# Patient Record
Sex: Female | Born: 1965 | Hispanic: Yes | Marital: Married | Smoking: Never smoker
Health system: Southern US, Community
[De-identification: ages and names within clinical notes are randomized; demographics above are authoritative.]

## PROBLEM LIST (undated history)

## (undated) DIAGNOSIS — E079 Disorder of thyroid, unspecified: Secondary | ICD-10-CM

## (undated) DIAGNOSIS — I10 Essential (primary) hypertension: Secondary | ICD-10-CM

## (undated) DIAGNOSIS — K8020 Calculus of gallbladder without cholecystitis without obstruction: Secondary | ICD-10-CM

---

## 1999-03-26 ENCOUNTER — Ambulatory Visit (HOSPITAL_COMMUNITY): Admission: RE | Admit: 1999-03-26 | Discharge: 1999-03-26 | Payer: Self-pay | Admitting: *Deleted

## 2000-02-19 ENCOUNTER — Ambulatory Visit (HOSPITAL_COMMUNITY): Admission: RE | Admit: 2000-02-19 | Discharge: 2000-02-19 | Payer: Self-pay | Admitting: *Deleted

## 2000-07-21 ENCOUNTER — Inpatient Hospital Stay (HOSPITAL_COMMUNITY): Admission: AD | Admit: 2000-07-21 | Discharge: 2000-07-21 | Payer: Self-pay | Admitting: Obstetrics

## 2000-07-22 ENCOUNTER — Inpatient Hospital Stay (HOSPITAL_COMMUNITY): Admission: AD | Admit: 2000-07-22 | Discharge: 2000-07-22 | Payer: Self-pay | Admitting: *Deleted

## 2000-07-23 ENCOUNTER — Inpatient Hospital Stay (HOSPITAL_COMMUNITY): Admission: AD | Admit: 2000-07-23 | Discharge: 2000-07-26 | Payer: Self-pay | Admitting: *Deleted

## 2000-07-23 ENCOUNTER — Encounter (INDEPENDENT_AMBULATORY_CARE_PROVIDER_SITE_OTHER): Payer: Self-pay | Admitting: Specialist

## 2000-08-04 ENCOUNTER — Inpatient Hospital Stay (HOSPITAL_COMMUNITY): Admission: AD | Admit: 2000-08-04 | Discharge: 2000-08-04 | Payer: Self-pay | Admitting: Obstetrics

## 2000-08-12 ENCOUNTER — Encounter: Admission: RE | Admit: 2000-08-12 | Discharge: 2000-08-12 | Payer: Self-pay | Admitting: Obstetrics & Gynecology

## 2000-08-25 ENCOUNTER — Emergency Department (HOSPITAL_COMMUNITY): Admission: EM | Admit: 2000-08-25 | Discharge: 2000-08-25 | Payer: Self-pay | Admitting: Emergency Medicine

## 2003-08-22 ENCOUNTER — Emergency Department (HOSPITAL_COMMUNITY): Admission: EM | Admit: 2003-08-22 | Discharge: 2003-08-22 | Payer: Self-pay | Admitting: Emergency Medicine

## 2003-08-22 ENCOUNTER — Encounter: Payer: Self-pay | Admitting: Emergency Medicine

## 2004-09-01 ENCOUNTER — Emergency Department (HOSPITAL_COMMUNITY): Admission: EM | Admit: 2004-09-01 | Discharge: 2004-09-02 | Payer: Self-pay | Admitting: Emergency Medicine

## 2005-03-05 ENCOUNTER — Emergency Department (HOSPITAL_COMMUNITY): Admission: EM | Admit: 2005-03-05 | Discharge: 2005-03-05 | Payer: Self-pay | Admitting: Emergency Medicine

## 2005-09-09 ENCOUNTER — Emergency Department (HOSPITAL_COMMUNITY): Admission: EM | Admit: 2005-09-09 | Discharge: 2005-09-10 | Payer: Self-pay | Admitting: Emergency Medicine

## 2006-03-18 ENCOUNTER — Ambulatory Visit: Payer: Self-pay | Admitting: Nurse Practitioner

## 2006-03-27 ENCOUNTER — Encounter (HOSPITAL_COMMUNITY): Admission: RE | Admit: 2006-03-27 | Discharge: 2006-06-25 | Payer: Self-pay | Admitting: Internal Medicine

## 2006-04-01 ENCOUNTER — Ambulatory Visit: Payer: Self-pay | Admitting: Nurse Practitioner

## 2006-04-09 ENCOUNTER — Ambulatory Visit: Payer: Self-pay | Admitting: Nurse Practitioner

## 2006-04-17 ENCOUNTER — Ambulatory Visit (HOSPITAL_COMMUNITY): Admission: RE | Admit: 2006-04-17 | Discharge: 2006-04-17 | Payer: Self-pay | Admitting: Family Medicine

## 2006-07-01 ENCOUNTER — Ambulatory Visit: Payer: Self-pay | Admitting: Nurse Practitioner

## 2006-07-16 ENCOUNTER — Ambulatory Visit: Payer: Self-pay | Admitting: Nurse Practitioner

## 2006-08-06 ENCOUNTER — Ambulatory Visit: Payer: Self-pay | Admitting: Nurse Practitioner

## 2006-10-01 ENCOUNTER — Ambulatory Visit: Payer: Self-pay | Admitting: Nurse Practitioner

## 2006-10-30 ENCOUNTER — Ambulatory Visit: Payer: Self-pay | Admitting: Nurse Practitioner

## 2006-10-31 ENCOUNTER — Ambulatory Visit: Payer: Self-pay | Admitting: *Deleted

## 2006-11-17 ENCOUNTER — Ambulatory Visit: Payer: Self-pay | Admitting: Nurse Practitioner

## 2007-04-20 ENCOUNTER — Ambulatory Visit (HOSPITAL_COMMUNITY): Admission: RE | Admit: 2007-04-20 | Discharge: 2007-04-20 | Payer: Self-pay | Admitting: Family Medicine

## 2007-04-23 ENCOUNTER — Encounter (INDEPENDENT_AMBULATORY_CARE_PROVIDER_SITE_OTHER): Payer: Self-pay | Admitting: Nurse Practitioner

## 2007-04-23 ENCOUNTER — Ambulatory Visit: Payer: Self-pay | Admitting: Nurse Practitioner

## 2007-05-06 ENCOUNTER — Ambulatory Visit: Payer: Self-pay | Admitting: Nurse Practitioner

## 2007-07-22 ENCOUNTER — Encounter (INDEPENDENT_AMBULATORY_CARE_PROVIDER_SITE_OTHER): Payer: Self-pay | Admitting: Nurse Practitioner

## 2007-07-22 ENCOUNTER — Ambulatory Visit: Payer: Self-pay | Admitting: Internal Medicine

## 2007-07-22 LAB — CONVERTED CEMR LAB
Cholesterol: 232 mg/dL — ABNORMAL HIGH (ref 0–200)
HDL: 63 mg/dL (ref >39)
Total CHOL/HDL Ratio: 3.7
Triglycerides: 77 mg/dL (ref <150)
VLDL: 15 mg/dL (ref 0–40)

## 2007-09-02 ENCOUNTER — Encounter (INDEPENDENT_AMBULATORY_CARE_PROVIDER_SITE_OTHER): Payer: Self-pay | Admitting: *Deleted

## 2007-10-05 ENCOUNTER — Ambulatory Visit: Payer: Self-pay | Admitting: Internal Medicine

## 2007-10-05 ENCOUNTER — Encounter (INDEPENDENT_AMBULATORY_CARE_PROVIDER_SITE_OTHER): Payer: Self-pay | Admitting: Nurse Practitioner

## 2007-10-05 LAB — CONVERTED CEMR LAB
Cholesterol: 204 mg/dL — ABNORMAL HIGH (ref 0–200)
VLDL: 17 mg/dL (ref 0–40)

## 2007-10-08 ENCOUNTER — Ambulatory Visit: Payer: Self-pay | Admitting: Internal Medicine

## 2007-10-08 ENCOUNTER — Encounter (INDEPENDENT_AMBULATORY_CARE_PROVIDER_SITE_OTHER): Payer: Self-pay | Admitting: Nurse Practitioner

## 2007-10-08 LAB — CONVERTED CEMR LAB
ALT: 16 units/L (ref 0–35)
AST: 18 units/L (ref 0–37)
Alkaline Phosphatase: 66 units/L (ref 39–117)
BUN: 13 mg/dL (ref 6–23)
CO2: 24 meq/L (ref 19–32)
Cholesterol: 233 mg/dL — ABNORMAL HIGH (ref 0–200)
Glucose, Bld: 86 mg/dL (ref 70–99)
Sodium: 137 meq/L (ref 135–145)
Total Bilirubin: 0.5 mg/dL (ref 0.3–1.2)
VLDL: 24 mg/dL (ref 0–40)

## 2007-10-15 ENCOUNTER — Ambulatory Visit (HOSPITAL_COMMUNITY): Admission: RE | Admit: 2007-10-15 | Discharge: 2007-10-15 | Payer: Self-pay | Admitting: Family Medicine

## 2007-12-03 ENCOUNTER — Encounter (INDEPENDENT_AMBULATORY_CARE_PROVIDER_SITE_OTHER): Payer: Self-pay | Admitting: Nurse Practitioner

## 2007-12-03 ENCOUNTER — Ambulatory Visit: Payer: Self-pay | Admitting: Family Medicine

## 2008-03-16 ENCOUNTER — Ambulatory Visit: Payer: Self-pay | Admitting: Internal Medicine

## 2008-03-22 ENCOUNTER — Encounter (INDEPENDENT_AMBULATORY_CARE_PROVIDER_SITE_OTHER): Payer: Self-pay | Admitting: Nurse Practitioner

## 2008-03-22 ENCOUNTER — Ambulatory Visit: Payer: Self-pay | Admitting: Internal Medicine

## 2008-05-11 ENCOUNTER — Ambulatory Visit (HOSPITAL_COMMUNITY): Admission: RE | Admit: 2008-05-11 | Discharge: 2008-05-11 | Payer: Self-pay | Admitting: Family Medicine

## 2008-06-23 ENCOUNTER — Ambulatory Visit: Payer: Self-pay | Admitting: Internal Medicine

## 2008-07-12 ENCOUNTER — Ambulatory Visit: Payer: Self-pay | Admitting: Family Medicine

## 2008-07-12 ENCOUNTER — Encounter (INDEPENDENT_AMBULATORY_CARE_PROVIDER_SITE_OTHER): Payer: Self-pay | Admitting: Family Medicine

## 2008-07-12 LAB — CONVERTED CEMR LAB
Chlamydia, DNA Probe: NEGATIVE
GC Probe Amp, Genital: NEGATIVE

## 2008-11-17 ENCOUNTER — Ambulatory Visit: Payer: Self-pay | Admitting: Internal Medicine

## 2008-11-25 ENCOUNTER — Ambulatory Visit: Payer: Self-pay | Admitting: Internal Medicine

## 2008-12-29 ENCOUNTER — Encounter (INDEPENDENT_AMBULATORY_CARE_PROVIDER_SITE_OTHER): Payer: Self-pay | Admitting: Family Medicine

## 2008-12-29 ENCOUNTER — Ambulatory Visit: Payer: Self-pay | Admitting: Internal Medicine

## 2008-12-29 LAB — CONVERTED CEMR LAB: TSH: 2.977 microintl units/mL (ref 0.350–4.50)

## 2009-01-24 ENCOUNTER — Ambulatory Visit: Payer: Self-pay | Admitting: Internal Medicine

## 2009-01-24 ENCOUNTER — Encounter (INDEPENDENT_AMBULATORY_CARE_PROVIDER_SITE_OTHER): Payer: Self-pay | Admitting: Family Medicine

## 2009-01-24 LAB — CONVERTED CEMR LAB
ALT: 16 units/L (ref 0–35)
BUN: 17 mg/dL (ref 6–23)
Calcium: 9.3 mg/dL (ref 8.4–10.5)
Chloride: 105 meq/L (ref 96–112)
Cholesterol: 226 mg/dL — ABNORMAL HIGH (ref 0–200)
HDL: 68 mg/dL (ref >39)
LDL Cholesterol: 141 mg/dL — ABNORMAL HIGH (ref 0–99)
Potassium: 4.4 meq/L (ref 3.5–5.3)
Total CHOL/HDL Ratio: 3.3
Total Protein: 8.1 g/dL (ref 6.0–8.3)
Triglycerides: 85 mg/dL (ref <150)
VLDL: 17 mg/dL (ref 0–40)

## 2009-01-26 ENCOUNTER — Ambulatory Visit: Payer: Self-pay | Admitting: Internal Medicine

## 2009-03-13 ENCOUNTER — Ambulatory Visit: Payer: Self-pay | Admitting: Internal Medicine

## 2009-04-03 ENCOUNTER — Ambulatory Visit: Payer: Self-pay | Admitting: Internal Medicine

## 2009-05-29 ENCOUNTER — Ambulatory Visit: Payer: Self-pay | Admitting: Internal Medicine

## 2009-05-31 ENCOUNTER — Ambulatory Visit (HOSPITAL_COMMUNITY): Admission: RE | Admit: 2009-05-31 | Discharge: 2009-05-31 | Payer: Self-pay | Admitting: Family Medicine

## 2009-06-01 ENCOUNTER — Ambulatory Visit: Payer: Self-pay | Admitting: Internal Medicine

## 2009-06-01 ENCOUNTER — Ambulatory Visit: Admission: RE | Admit: 2009-06-01 | Discharge: 2009-06-01 | Payer: Self-pay | Admitting: Internal Medicine

## 2009-06-01 ENCOUNTER — Emergency Department (HOSPITAL_COMMUNITY): Admission: EM | Admit: 2009-06-01 | Discharge: 2009-06-01 | Payer: Self-pay | Admitting: Emergency Medicine

## 2009-06-15 ENCOUNTER — Encounter: Admission: RE | Admit: 2009-06-15 | Discharge: 2009-06-15 | Payer: Self-pay | Admitting: *Deleted

## 2009-07-24 ENCOUNTER — Encounter: Admission: RE | Admit: 2009-07-24 | Discharge: 2009-09-12 | Payer: Self-pay | Admitting: Orthopedic Surgery

## 2009-07-24 ENCOUNTER — Ambulatory Visit: Payer: Self-pay | Admitting: Internal Medicine

## 2009-12-12 ENCOUNTER — Emergency Department (HOSPITAL_COMMUNITY): Admission: EM | Admit: 2009-12-12 | Discharge: 2009-12-13 | Payer: Self-pay | Admitting: Emergency Medicine

## 2009-12-14 ENCOUNTER — Ambulatory Visit: Payer: Self-pay | Admitting: Internal Medicine

## 2009-12-14 ENCOUNTER — Encounter (INDEPENDENT_AMBULATORY_CARE_PROVIDER_SITE_OTHER): Payer: Self-pay | Admitting: Family Medicine

## 2009-12-14 LAB — CONVERTED CEMR LAB
Chlamydia, DNA Probe: NEGATIVE
GC Probe Amp, Genital: NEGATIVE

## 2009-12-26 ENCOUNTER — Ambulatory Visit: Payer: Self-pay | Admitting: Internal Medicine

## 2009-12-26 LAB — CONVERTED CEMR LAB: TSH: 1.092 microintl units/mL (ref 0.350–4.500)

## 2010-02-07 ENCOUNTER — Ambulatory Visit: Payer: Self-pay | Admitting: Obstetrics and Gynecology

## 2010-02-07 ENCOUNTER — Other Ambulatory Visit: Admission: RE | Admit: 2010-02-07 | Discharge: 2010-02-07 | Payer: Self-pay | Admitting: Obstetrics and Gynecology

## 2010-04-04 ENCOUNTER — Encounter (INDEPENDENT_AMBULATORY_CARE_PROVIDER_SITE_OTHER): Payer: Self-pay | Admitting: Family Medicine

## 2010-04-04 ENCOUNTER — Ambulatory Visit: Payer: Self-pay | Admitting: Internal Medicine

## 2010-04-04 LAB — CONVERTED CEMR LAB: TSH: 0.013 microintl units/mL — ABNORMAL LOW (ref 0.350–4.500)

## 2010-05-01 ENCOUNTER — Ambulatory Visit: Payer: Self-pay | Admitting: Internal Medicine

## 2010-05-01 LAB — CONVERTED CEMR LAB
BUN: 14 mg/dL (ref 6–23)
CO2: 23 meq/L (ref 19–32)
Free T4: 1.04 ng/dL (ref 0.80–1.80)
HDL: 49 mg/dL (ref >39)
Sodium: 139 meq/L (ref 135–145)
T3, Free: 2.6 pg/mL (ref 2.3–4.2)
VLDL: 23 mg/dL (ref 0–40)

## 2010-05-09 ENCOUNTER — Ambulatory Visit: Payer: Self-pay | Admitting: Internal Medicine

## 2010-05-23 ENCOUNTER — Ambulatory Visit: Payer: Self-pay | Admitting: Family Medicine

## 2010-05-30 ENCOUNTER — Ambulatory Visit: Payer: Self-pay | Admitting: Internal Medicine

## 2010-06-07 ENCOUNTER — Ambulatory Visit: Payer: Self-pay | Admitting: Internal Medicine

## 2010-07-12 ENCOUNTER — Ambulatory Visit: Payer: Self-pay | Admitting: Internal Medicine

## 2010-07-12 LAB — CONVERTED CEMR LAB
BUN: 17 mg/dL (ref 6–23)
Calcium: 9.3 mg/dL (ref 8.4–10.5)
Creatinine, Ser: 0.8 mg/dL (ref 0.40–1.20)
Direct LDL: 163 mg/dL — ABNORMAL HIGH
TSH: 11.404 microintl units/mL — ABNORMAL HIGH (ref 0.350–4.500)

## 2010-08-09 ENCOUNTER — Emergency Department (HOSPITAL_COMMUNITY): Admission: EM | Admit: 2010-08-09 | Discharge: 2010-08-10 | Payer: Self-pay | Admitting: Emergency Medicine

## 2010-10-09 ENCOUNTER — Encounter (INDEPENDENT_AMBULATORY_CARE_PROVIDER_SITE_OTHER): Payer: Self-pay | Admitting: *Deleted

## 2010-10-09 LAB — CONVERTED CEMR LAB
ALT: 16 units/L (ref 0–35)
Albumin: 4.5 g/dL (ref 3.5–5.2)
Alkaline Phosphatase: 76 units/L (ref 39–117)
BUN: 15 mg/dL (ref 6–23)
CO2: 26 meq/L (ref 19–32)
Creatinine, Ser: 0.8 mg/dL (ref 0.40–1.20)
Sodium: 137 meq/L (ref 135–145)
Total Bilirubin: 0.3 mg/dL (ref 0.3–1.2)
Total Protein: 7.9 g/dL (ref 6.0–8.3)

## 2010-11-08 ENCOUNTER — Emergency Department (HOSPITAL_COMMUNITY)
Admission: EM | Admit: 2010-11-08 | Discharge: 2010-11-09 | Payer: Self-pay | Source: Home / Self Care | Admitting: Emergency Medicine

## 2011-02-26 ENCOUNTER — Encounter (INDEPENDENT_AMBULATORY_CARE_PROVIDER_SITE_OTHER): Payer: Self-pay | Admitting: *Deleted

## 2011-02-26 LAB — URINALYSIS, ROUTINE W REFLEX MICROSCOPIC
Bilirubin Urine: NEGATIVE
Glucose, UA: NEGATIVE mg/dL
Leukocytes, UA: NEGATIVE
Protein, ur: NEGATIVE mg/dL
Specific Gravity, Urine: 1.023 (ref 1.005–1.030)
Urobilinogen, UA: 0.2 mg/dL (ref 0.0–1.0)

## 2011-02-26 LAB — URINE MICROSCOPIC-ADD ON

## 2011-02-26 LAB — DIFFERENTIAL
Basophils Relative: 1 % (ref 0–1)
Eosinophils Relative: 2 % (ref 0–5)
Lymphs Abs: 2.8 10*3/uL (ref 0.7–4.0)

## 2011-02-26 LAB — COMPREHENSIVE METABOLIC PANEL
AST: 23 U/L (ref 0–37)
BUN: 15 mg/dL (ref 6–23)
CO2: 25 mEq/L (ref 19–32)
Calcium: 9.4 mg/dL (ref 8.4–10.5)
Creatinine, Ser: 0.81 mg/dL (ref 0.4–1.2)
GFR calc Af Amer: >60 mL/min (ref >60)
GFR calc non Af Amer: >60 mL/min (ref >60)
Sodium: 138 mEq/L (ref 135–145)

## 2011-02-26 LAB — CBC
Hemoglobin: 15.3 g/dL — ABNORMAL HIGH (ref 12.0–15.0)
MCH: 31.7 pg (ref 26.0–34.0)
MCHC: 34.8 g/dL (ref 30.0–36.0)
Platelets: 233 10*3/uL (ref 150–400)
WBC: 7.8 10*3/uL (ref 4.0–10.5)

## 2011-02-26 LAB — CONVERTED CEMR LAB
Calcium: 9.4 mg/dL (ref 8.4–10.5)
Glucose, Bld: 85 mg/dL (ref 70–99)
Potassium: 3.7 meq/L (ref 3.5–5.3)
Sodium: 136 meq/L (ref 135–145)

## 2011-02-28 LAB — COMPREHENSIVE METABOLIC PANEL
ALT: 27 U/L (ref 0–35)
Alkaline Phosphatase: 57 U/L (ref 39–117)
BUN: 15 mg/dL (ref 6–23)
CO2: 22 mEq/L (ref 19–32)
Chloride: 108 mEq/L (ref 96–112)
GFR calc non Af Amer: >60 mL/min (ref >60)
Glucose, Bld: 87 mg/dL (ref 70–99)
Potassium: 2.7 mEq/L — CL (ref 3.5–5.1)
Sodium: 136 mEq/L (ref 135–145)
Total Bilirubin: 0.5 mg/dL (ref 0.3–1.2)
Total Protein: 7.3 g/dL (ref 6.0–8.3)

## 2011-02-28 LAB — URINALYSIS, ROUTINE W REFLEX MICROSCOPIC
Bilirubin Urine: NEGATIVE
Nitrite: NEGATIVE
Protein, ur: NEGATIVE mg/dL
Specific Gravity, Urine: 1.025 (ref 1.005–1.030)
Urobilinogen, UA: 1 mg/dL (ref 0.0–1.0)

## 2011-02-28 LAB — DIFFERENTIAL
Basophils Absolute: 0 10*3/uL (ref 0.0–0.1)
Basophils Relative: 0 % (ref 0–1)
Eosinophils Absolute: 0.1 10*3/uL (ref 0.0–0.7)
Monocytes Relative: 5 % (ref 3–12)
Neutro Abs: 8.2 10*3/uL — ABNORMAL HIGH (ref 1.7–7.7)
Neutrophils Relative %: 80 % — ABNORMAL HIGH (ref 43–77)

## 2011-02-28 LAB — CBC
HCT: 41 % (ref 36.0–46.0)
Hemoglobin: 14.2 g/dL (ref 12.0–15.0)
MCV: 91.4 fL (ref 78.0–100.0)
RDW: 13.4 % (ref 11.5–15.5)
WBC: 10.3 10*3/uL (ref 4.0–10.5)

## 2011-02-28 LAB — URINE MICROSCOPIC-ADD ON

## 2011-02-28 LAB — POCT PREGNANCY, URINE: Preg Test, Ur: NEGATIVE

## 2011-03-18 LAB — URINE MICROSCOPIC-ADD ON

## 2011-03-18 LAB — COMPREHENSIVE METABOLIC PANEL
BUN: 12 mg/dL (ref 6–23)
Calcium: 8.9 mg/dL (ref 8.4–10.5)
Glucose, Bld: 111 mg/dL — ABNORMAL HIGH (ref 70–99)
Total Protein: 7.4 g/dL (ref 6.0–8.3)

## 2011-03-18 LAB — DIFFERENTIAL
Lymphocytes Relative: 21 % (ref 12–46)
Lymphs Abs: 1.4 10*3/uL (ref 0.7–4.0)
Monocytes Relative: 3 % (ref 3–12)
Neutro Abs: 5 10*3/uL (ref 1.7–7.7)
Neutrophils Relative %: 74 % (ref 43–77)

## 2011-03-18 LAB — URINALYSIS, ROUTINE W REFLEX MICROSCOPIC
Ketones, ur: NEGATIVE mg/dL
Leukocytes, UA: NEGATIVE
Nitrite: NEGATIVE
Protein, ur: NEGATIVE mg/dL
pH: 6 (ref 5.0–8.0)

## 2011-03-18 LAB — CBC
HCT: 40.8 % (ref 36.0–46.0)
Hemoglobin: 13.9 g/dL (ref 12.0–15.0)
MCHC: 34 g/dL (ref 30.0–36.0)
Platelets: 231 10*3/uL (ref 150–400)
RDW: 13.9 % (ref 11.5–15.5)

## 2011-05-03 NOTE — Discharge Summary (Signed)
[facility]  Patient:    Deborah Aguirre, Deborah Aguirre                        MRN: [id number] Adm. Date:  [id number] Disc. Date: [id number] Attending:  Michaelle Copas Dictator:   Pricilla Holm, M.D.                           Discharge Summary  PROCEDURES:                   Repeat low transverse cesarean section.  HOSPITAL COURSE:              This is a 45 year old Hispanic female, g2, p1-0-0-1, at 40 weeks and two days who presented to triage complaining of contractions since early morning.  She was having a small amount of bleeding but no loss of fluid at that time.  She was admitted to O&D in active labor and had artificial rupture of membranes at that time.  She requested an epidural.  She was followed closely and was found to have arrest of dilatation with a compound presentation.  She was found to have meconium stained fluid upon artificial rupture of membranes.  She has a history of previous C-section for arrest of dilatation.  The patient was taken to the OR on July 23, 2000, secondary to an unchanged exam with arm presenting and asynclitic vertex position and very high.  The patient underwent repeat low transverse c-section with Dr. Coral Ceo as surgeon and Dr. ______  as assistant.  Anesthesia was epidural anesthesia.  The patient tolerated the procedure well and findings were a viable female at 15:56 with Apgars of 8 at one minute and 9 at five minutes weighing 8 pounds and 1 ounce.  Cord pH of 7.38.  Normal uterus, ovaries, and fallopian tubes were found.  The patient tolerated the procedure well and was taken to recovery room .  The patient received routine postoperative care and was found to be doing quite well.  Postoperative day #3, she was deemed appropriate to go home.  LABORATORY DATA:              Hemoglobin 9.6, hematocrit 29.6 on July 24, 2000.  CONDITION ON DISCHARGE:       Good.  DISPOSITION:                  Discharged to  home with specific instructions.  DISCHARGE MEDICATIONS:        The patient was given a prescription for Vicodin 1-2 tablets p.o. q.6h. p.r.n. #20, instructed to take ibuprofen over-the-counter 1-2 q.4-6h. p.r.n. pain.  The patient was also given Micronor 1 p.o. q.d. as directed with one refill.  INSTRUCTIONS:                 The patient was given a booklet on activities and diet as well as wound and dressing changes.  FOLLOW-UP:                    The patient will follow-up in six weeks at [facility] for her six week follow-up.                                The patient voiced agreement and understanding of the above discharge plan and had no further questions. DD:  07/26/00 TD:  07/28/00 Job: [id number]

## 2011-05-03 NOTE — Op Note (Signed)
[facility]  Patient:    Deborah Aguirre, Deborah Aguirre                        MRN: [id number] Proc. Date: 07/23/00 Attending:  Michaelle Copas CC:         OB/GYN teaching service; attn: Jamey Reas, M.D.   Operative Report  PREOPERATIVE DIAGNOSES:       1. Arrest of dilation.                               2. Compound presentation.                               3. Meconium stained fluid.                               4. Previous cesarean section for arrest of                                  dilation.  POSTOPERATIVE DIAGNOSES:      1. Arrest of dilation.                               2. Compound presentation.                               3. Meconium stained fluid.                               4. Previous cesarean section for arrest of                                  dilation.  OPERATION:                    Repeat low transverse cesarean section.  SURGEON:                      Charles A. Clearance Coots, M.D.  ASSISTANT:                    Jamey Reas, M.D.  ANESTHESIA:                   Epidural anesthesia.  ESTIMATED BLOOD LOSS:         700 mL.  IV FLUIDS:                    400 mL.  URINE OUTPUT:                 300 mL clear.  COMPLICATIONS:                None.  DRAINS:                       Foley catheter to gravity.  FINDINGS:                     Viable female at 44, Apgars of  8 at one minute and 9 at five minutes, weight of 8 pounds and 1 ounce.  Cord pH of 7.38. Normal uterus, ovaries and fallopian tubes.  DESCRIPTION OF PROCEDURE:  The patient was brought to the operating room and after satisfactory redosing of the epidural, the abdomen was prepped and draped in a usual sterile fashion.  A Pfannenstiel skin incision was made through the previous scar with a scalpel that was deepened down to the fascia with a scalpel. The fascia was nicked in the midline and the fascial incision was extended to the left and to the right with  curved Mayo scissors. The superior and inferior fascial edges were taken off of the rectus muscle with both blunt and sharp dissection.  The rectus muscle was sharply and bluntly divided in the midline.  The peritoneum was digitally entered and was extended to the left and to the right digitally.  The bladder blade was positioned. The vesicouterine fold of peritoneum above the reflection of the urinary bladder was grasped with forceps and was incised and undermined with Metzenbaum scissors. The incision was extended to the left and to the right with Metzenbaum scissors.  The bladder flap was bluntly developed and the bladder blade was repositioned in front of the urinary bladder, placing it well out of operative field.  The uterus was entered transversely in the lower uterine segment with the scalpel. The uterine incision was then digitally extended to the left and to the right.  Moderate amount of meconium stained fluid was expelled.  The vertex was then delivered with the aid of fundal pressure from the assistant.  The infants mouth and nose was suctioned with the DeLee suction and the delivery was completed with the aid of fundal pressure from the assistant.  The umbilical cord was clamped and cut and the infant was handed off to the nursery staff.  Cord pH and cord blood were obtained and the placenta was spontaneously expelled from the uterine cavity intact.  The uterus was exteriorized and the endometrial surface was debrided with a dry lap sponge.  The edges of the uterine incision were grasped with ring forceps and the uterus was closed with continuous interlocking sutures of 0 Monocryl. Hemostasis was excellent. The uterus was then placed back in its normal anatomic position.  The pelvic cavity was thoroughly irrigated with warm saline solution and all clots were removed.  The abdomen was then closed as follows.  The rectus muscle was approximated with a few interrupted sutures  of 2-0 Monocryl.  The fascia was closed with a continuous suture of 0 PDS.  The subcutaneous tissue was thoroughly irrigated with warm saline solution and all areas of subcutaneous bleeding were coagulated with the Bovie.  The skin was then approximated with surgical stainless steel staples.  Sterile bandage was applied to the incision closure.  Surgical technician indicated that all sponge, needle and instrument counts were correct.  The patient tolerated the procedure well and was transported to the recovery room in satisfactory condition. DD:  07/23/00 TD:  07/24/00 Job: [id number]

## 2011-10-24 ENCOUNTER — Other Ambulatory Visit (HOSPITAL_COMMUNITY)
Admission: RE | Admit: 2011-10-24 | Discharge: 2011-10-24 | Disposition: A | Discharge status: Home / Self Care | Payer: Self-pay | Source: Ambulatory Visit | Attending: Family Medicine | Admitting: Family Medicine

## 2011-10-24 ENCOUNTER — Other Ambulatory Visit: Payer: Self-pay | Admitting: Family Medicine

## 2011-10-24 DIAGNOSIS — Z01419 Encounter for gynecological examination (general) (routine) without abnormal findings: Secondary | ICD-10-CM | POA: Insufficient documentation

## 2012-02-09 ENCOUNTER — Emergency Department (HOSPITAL_COMMUNITY)
Admission: EM | Admit: 2012-02-09 | Discharge: 2012-02-10 | Disposition: A | Discharge status: Home / Self Care | Payer: Self-pay | Attending: Emergency Medicine | Admitting: Emergency Medicine

## 2012-02-09 ENCOUNTER — Emergency Department (HOSPITAL_COMMUNITY): Payer: Self-pay

## 2012-02-09 ENCOUNTER — Encounter (HOSPITAL_COMMUNITY): Payer: Self-pay | Admitting: *Deleted

## 2012-02-09 DIAGNOSIS — I10 Essential (primary) hypertension: Secondary | ICD-10-CM | POA: Insufficient documentation

## 2012-02-09 DIAGNOSIS — Z79899 Other long term (current) drug therapy: Secondary | ICD-10-CM | POA: Insufficient documentation

## 2012-02-09 DIAGNOSIS — R1011 Right upper quadrant pain: Secondary | ICD-10-CM | POA: Insufficient documentation

## 2012-02-09 DIAGNOSIS — K8020 Calculus of gallbladder without cholecystitis without obstruction: Secondary | ICD-10-CM | POA: Insufficient documentation

## 2012-02-09 DIAGNOSIS — E039 Hypothyroidism, unspecified: Secondary | ICD-10-CM | POA: Insufficient documentation

## 2012-02-09 HISTORY — DX: Calculus of gallbladder without cholecystitis without obstruction: K80.20

## 2012-02-09 HISTORY — DX: Essential (primary) hypertension: I10

## 2012-02-09 HISTORY — DX: Disorder of thyroid, unspecified: E07.9

## 2012-02-09 LAB — URINALYSIS, ROUTINE W REFLEX MICROSCOPIC
Glucose, UA: NEGATIVE mg/dL
Leukocytes, UA: NEGATIVE
Protein, ur: NEGATIVE mg/dL
Specific Gravity, Urine: 1.015 (ref 1.005–1.030)
pH: 6 (ref 5.0–8.0)

## 2012-02-09 LAB — CBC
HCT: 42.2 % (ref 36.0–46.0)
RDW: 12.3 % (ref 11.5–15.5)
WBC: 11.9 10*3/uL — ABNORMAL HIGH (ref 4.0–10.5)

## 2012-02-09 LAB — BASIC METABOLIC PANEL
CO2: 23 mEq/L (ref 19–32)
Chloride: 99 mEq/L (ref 96–112)
Creatinine, Ser: 0.68 mg/dL (ref 0.50–1.10)
Sodium: 132 mEq/L — ABNORMAL LOW (ref 135–145)

## 2012-02-09 LAB — DIFFERENTIAL
Basophils Absolute: 0 10*3/uL (ref 0.0–0.1)
Lymphocytes Relative: 12 % (ref 12–46)
Monocytes Absolute: 0.5 10*3/uL (ref 0.1–1.0)
Neutro Abs: 9.8 10*3/uL — ABNORMAL HIGH (ref 1.7–7.7)

## 2012-02-09 LAB — URINE MICROSCOPIC-ADD ON

## 2012-02-09 LAB — PREGNANCY, URINE: Preg Test, Ur: NEGATIVE

## 2012-02-09 NOTE — ED Notes (Signed)
[place] at bedside at this time. Lab services called and additional labs added on.

## 2012-02-09 NOTE — ED Notes (Signed)
Pt in c/o abd pain, history of gallstones, denies vomiting today

## 2012-02-09 NOTE — ED Notes (Signed)
Patient reports right flank pain radiating around to her RUQ ABD that began at approx. 1900 tonight accompanied with nausea. Denies emesis. Hx of gallstones. Patient reports taking expired prescription of Hydrocodone-Acetaminophin 10-650 mg 1 tab, pta with no relief.

## 2012-02-10 LAB — HEPATIC FUNCTION PANEL
ALT: 31 U/L (ref 0–35)
Bilirubin, Direct: <0.1 mg/dL (ref 0.0–0.3)
Total Protein: 8 g/dL (ref 6.0–8.3)

## 2012-02-10 LAB — LIPASE, BLOOD: Lipase: 30 U/L (ref 11–59)

## 2012-02-10 MED ORDER — HYDROCODONE-ACETAMINOPHEN 5-325 MG PO TABS: 1.0000 | ORAL_TABLET | ORAL | Status: AC | PRN

## 2012-02-10 NOTE — ED Provider Notes (Signed)
Medical screening examination/treatment/procedure(s) were performed by non-physician practitioner and as supervising physician I was immediately available for consultation/collaboration.   Lyanne Co, MD 02/10/12 1556

## 2012-02-10 NOTE — ED Provider Notes (Signed)
History     CSN: [id number]  Arrival date & time 02/09/12  2100   First MD Initiated Contact with Patient 02/09/12 2227      Chief Complaint  Patient presents with  . Abdominal Pain    (Consider location/radiation/quality/duration/timing/severity/associated sxs/prior treatment) HPI Comments: Patient with history of known gallstones presents with RUQ abdominal pain that started today at 1300 - this was not associated with food - she denies fever, chills, nausea, vomiting, diarrhea, constipation with this - states she took 1/2 pain medication and then a second 1/2 without relief of pain - states that once she arrived here, the pain spontaneously resolved.  Patient is a 46 y.o. female presenting with abdominal pain. The history is provided by the patient and the spouse. No language interpreter was used.  Abdominal Pain The primary symptoms of the illness include abdominal pain. The primary symptoms of the illness do not include fever, fatigue, shortness of breath, nausea, vomiting, diarrhea, hematemesis, hematochezia, dysuria, vaginal discharge or vaginal bleeding. The current episode started 6 to 12 hours ago. The onset of the illness was gradual. The problem has been resolved.  The patient states that she believes she is currently not pregnant. The patient has not had a change in bowel habit. Symptoms associated with the illness do not include chills, anorexia, diaphoresis, heartburn, constipation, urgency, hematuria, frequency or back pain. Significant associated medical issues include gallstones.    Past Medical History  Diagnosis Date  . Gallstone   . Hypertension   . Thyroid disease     History reviewed. No pertinent past surgical history.  History reviewed. No pertinent family history.  History  Substance Use Topics  . Smoking status: Not on file  . Smokeless tobacco: Not on file  . Alcohol Use:     OB History    Grav Para Term Preterm Abortions TAB SAB Ect Mult Living                    Review of Systems  Constitutional: Negative for fever, chills, diaphoresis and fatigue.  Respiratory: Negative for shortness of breath.   Gastrointestinal: Positive for abdominal pain. Negative for heartburn, nausea, vomiting, diarrhea, constipation, hematochezia, anorexia and hematemesis.  Genitourinary: Negative for dysuria, urgency, frequency, hematuria, vaginal bleeding and vaginal discharge.  Musculoskeletal: Negative for back pain.  All other systems reviewed and are negative.    Allergies  Review of patient's allergies indicates no known allergies.  Home Medications   Current Outpatient Rx  Name Route Sig Dispense Refill  . AMLODIPINE BESYLATE 10 MG PO TABS Oral Take 10 mg by mouth daily.    Marland Kitchen HYDROCHLOROTHIAZIDE 25 MG PO TABS Oral Take 25 mg by mouth daily.    Marland Kitchen HYDROCODONE-ACETAMINOPHEN 10-650 MG PO TABS Oral Take 1 tablet by mouth every 6 (six) hours as needed. Pain    . LEVOTHYROXINE SODIUM 100 MCG PO TABS Oral Take 100 mcg by mouth daily.    Marland Kitchen METOPROLOL SUCCINATE ER 50 MG PO TB24 Oral Take 50 mg by mouth daily. Take with or immediately following a meal.      BP 136/80  Pulse 73  Temp(Src) 98.5 F (36.9 C) (Oral)  Resp 16  SpO2 96%  Physical Exam  Nursing note and vitals reviewed. Constitutional: She is oriented to person, place, and time. She appears well-developed and well-nourished. No distress.  HENT:  Head: Normocephalic and atraumatic.  Right Ear: External ear normal.  Left Ear: External ear normal.  Nose: Nose normal.  Mouth/Throat: Oropharynx is clear and moist. No oropharyngeal exudate.  Eyes: Conjunctivae are normal. Pupils are equal, round, and reactive to light. No scleral icterus.  Neck: Normal range of motion. Neck supple.  Cardiovascular: Normal rate, regular rhythm and normal heart sounds.  Exam reveals no gallop and no friction rub.   No murmur heard. Pulmonary/Chest: Effort normal and breath sounds normal. No respiratory  distress. She exhibits no tenderness.  Abdominal: Soft. Bowel sounds are normal. She exhibits no distension and no mass. There is no tenderness. There is no rebound and no guarding.  Musculoskeletal: Normal range of motion. She exhibits no edema and no tenderness.  Lymphadenopathy:    She has no cervical adenopathy.  Neurological: She is alert and oriented to person, place, and time. No cranial nerve deficit.  Skin: Skin is warm and dry. No rash noted. No erythema. No pallor.  Psychiatric: She has a normal mood and affect. Her behavior is normal. Judgment and thought content normal.    ED Course  Procedures (including critical care time)  Labs Reviewed  URINALYSIS, ROUTINE W REFLEX MICROSCOPIC - Abnormal; Notable for the following:    Hgb urine dipstick TRACE (*)    All other components within normal limits  CBC - Abnormal; Notable for the following:    WBC 11.9 (*)    Hemoglobin 15.4 (*)    MCHC 36.5 (*)    All other components within normal limits  DIFFERENTIAL - Abnormal; Notable for the following:    Neutrophils Relative 82 (*)    Neutro Abs 9.8 (*)    All other components within normal limits  BASIC METABOLIC PANEL - Abnormal; Notable for the following:    Sodium 132 (*)    Potassium 3.3 (*)    Glucose, Bld 119 (*)    All other components within normal limits  URINE MICROSCOPIC-ADD ON - Abnormal; Notable for the following:    Squamous Epithelial / LPF FEW (*)    All other components within normal limits  HEPATIC FUNCTION PANEL - Abnormal; Notable for the following:    Total Bilirubin 0.2 (*)    All other components within normal limits  PREGNANCY, URINE  LIPASE, BLOOD   US Abdomen Complete  02/10/2012  *RADIOLOGY REPORT*  Clinical Data:  Right upper quadrant abdominal pain; history of gallstones.  Leukocytosis.  ABDOMINAL ULTRASOUND COMPLETE  Comparison:  Abdominal ultrasound performed 11/09/2010  Findings:  Gallbladder: Two large stones are noted within the gallbladder,  both measuring 2.8 cm in size.  One of these is lodged at the neck of the gallbladder, as on the prior study.  Both stones appear to have increased mildly in size since 2011.  There is no evidence of gallbladder wall thickening or pericholecystic fluid to suggest cholecystitis.  There is no evidence for obstruction.  No ultrasonographic Murphy's sign is elicited.  Common Bile Duct:  0.6 cm in diameter; within normal limits in caliber.  Liver:  Normal parenchymal echogenicity and echotexture; no focal lesions identified.  Limited Doppler evaluation demonstrates normal blood flow within the liver.  IVC:  Unremarkable in appearance.  Pancreas:  Although the pancreas is difficult to visualize in its entirety due to overlying bowel gas, no focal pancreatic abnormality is identified.  Spleen:  9.1 cm in length; within normal limits in size and echotexture.  Right kidney:  10.8 cm in length; normal in size, configuration and parenchymal echogenicity.  No evidence of mass or hydronephrosis.  Left kidney:  11.3 cm in length; normal in  size, configuration and parenchymal echogenicity.  No evidence of mass or hydronephrosis.  Abdominal Aorta:  Normal in caliber; no aneurysm identified.  IMPRESSION: Cholelithiasis again noted, without evidence for obstruction or cholecystitis.  One of the two large stones remains lodged at the neck of the gallbladder, unchanged from 2011.  Both stones have increased mildly in size since 2011.  No ultrasonographic Murphy's sign seen.  Original Report Authenticated By: Tonia Ghent, M.D.     Cholelithiasis    MDM  Patient with known gallstones without signs of cholecystitis, she does have a noted leukocytosis but I do not believe this is related to infection.  Afebrile and without pain since her arrival to the ER.  Will refer to surgery on an outpatient basis and give pain medication - she has been instructed to return with worsening pain, pain not controlled with medication, fever,  chills, nausea or vomiting.  She and her husband express understanding of this.        Izola Price [place], [place] 02/10/12 [phone]

## 2012-04-18 IMAGING — US US ABDOMEN COMPLETE
1 series · 13 of 25 positions shown · non-contrast
Comparison: Abdominal ultrasound performed 11/09/2010

CLINICAL DATA: Right upper quadrant abdominal pain; history of
gallstones.  Leukocytosis.

ABDOMINAL ULTRASOUND COMPLETE

[Series 1: us abdomen complete · 0.26mm/px · 13 of 58 slices shown]
[im 1/58]
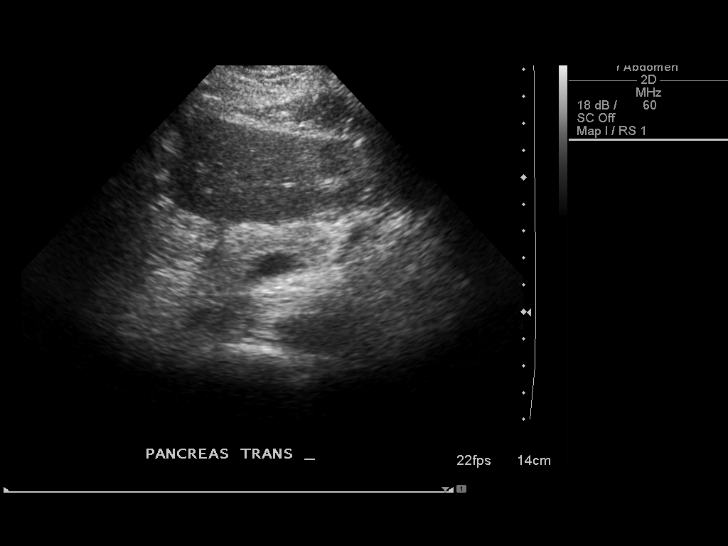
[im 5/58]
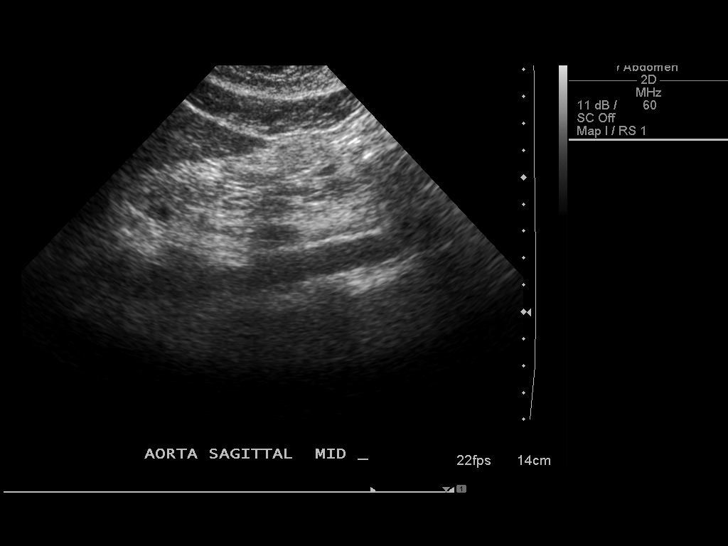
[im 10/58]
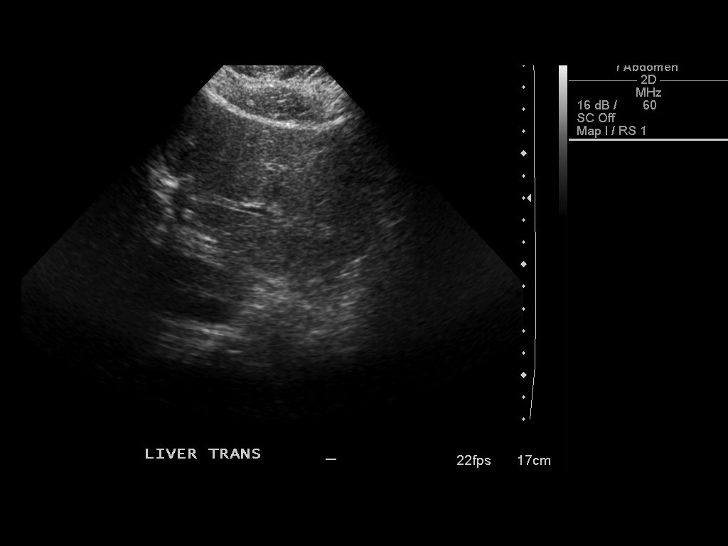
[im 15/58]
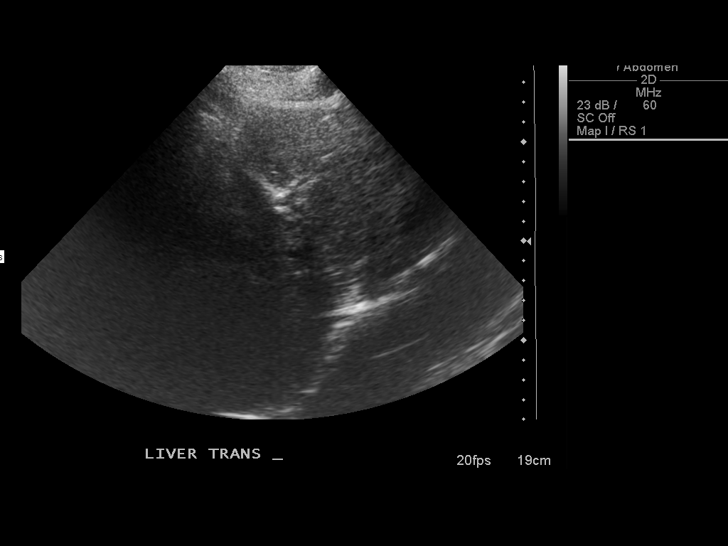
[im 20/58]
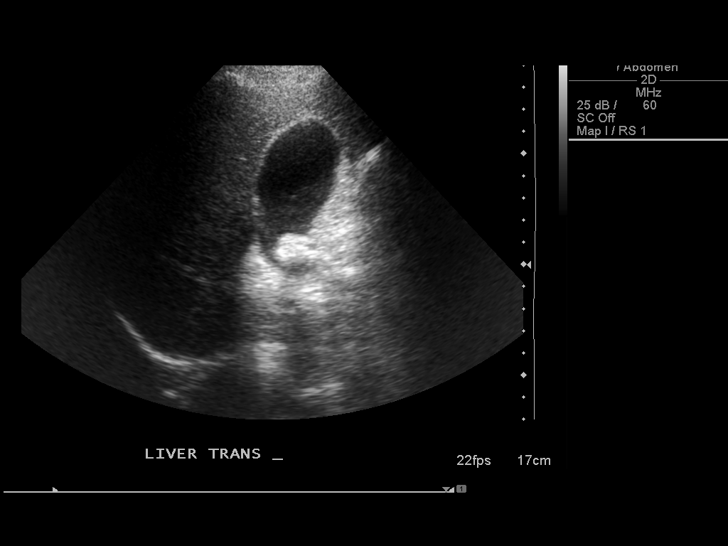
[im 24/58]
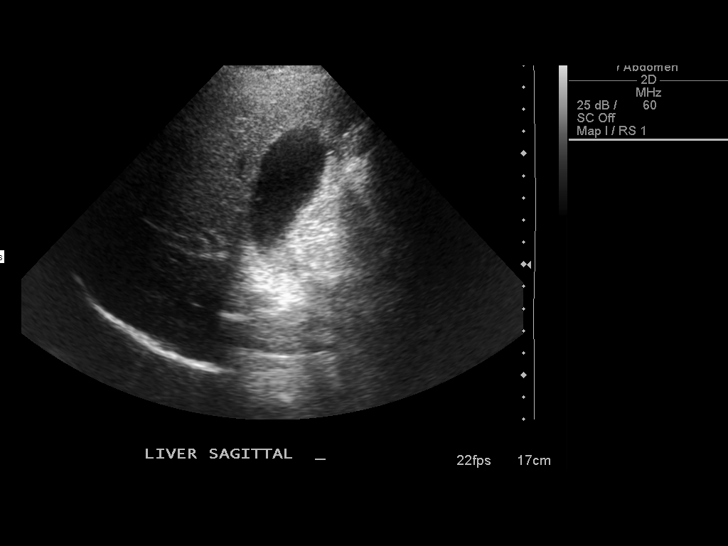
[im 29/58]
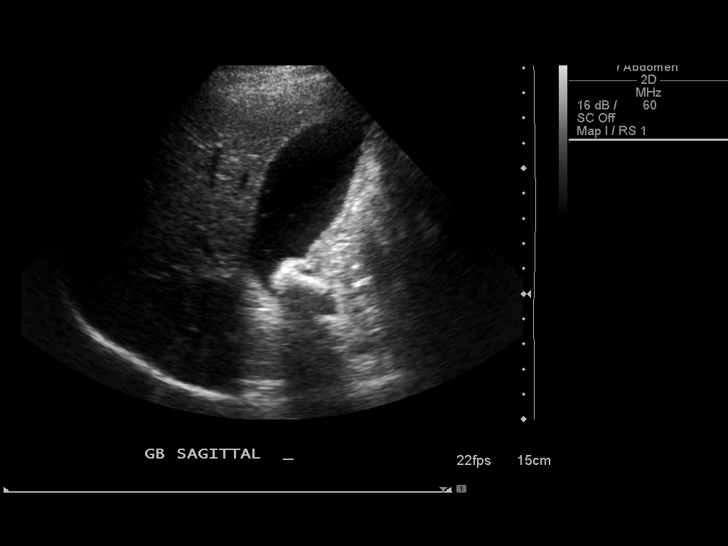
[im 34/58]
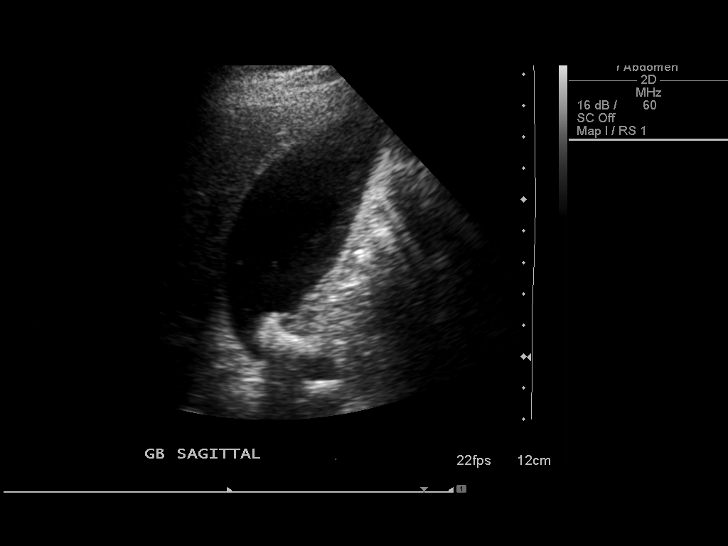
[im 39/58]
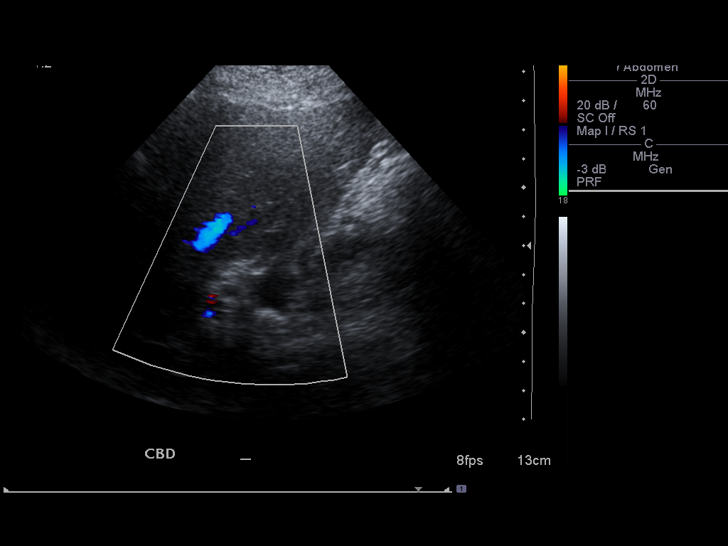
[im 43/58]
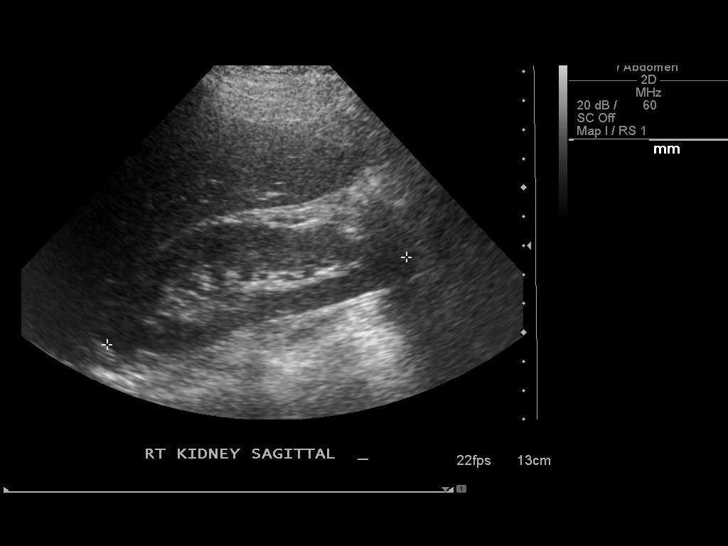
[im 48/58]
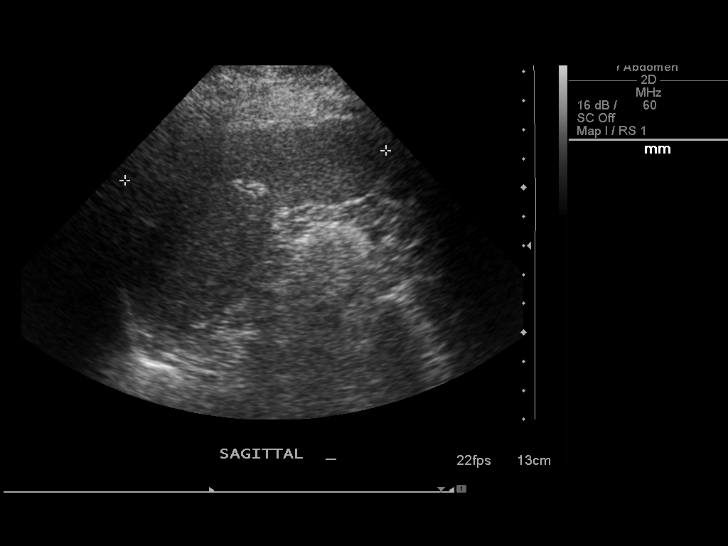
[im 53/58]
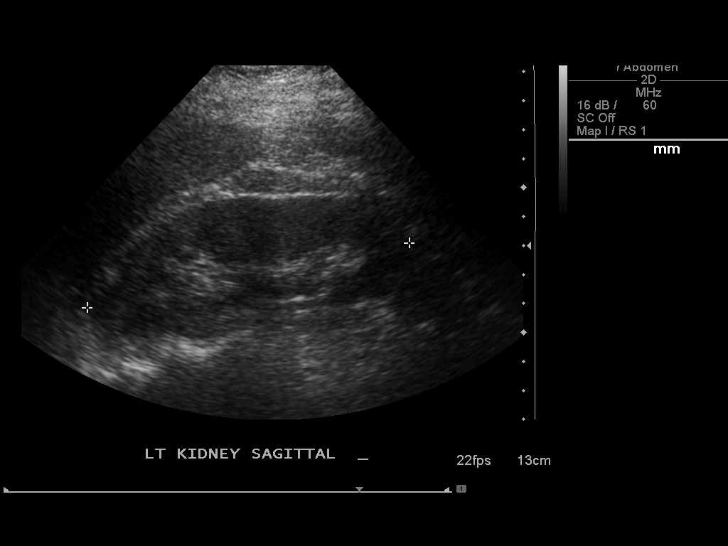
[im 58/58]
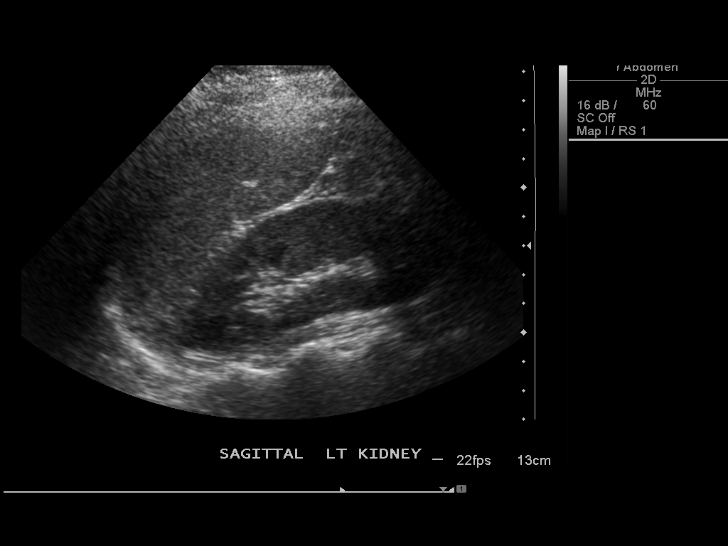

[13 of 25 positions shown; findings below may reference images not displayed]

FINDINGS: Gallbladder: Two large stones are noted within the gallbladder,
both measuring 2.8 cm in size.  One of these is lodged at the neck
of the gallbladder, as on the prior study.  Both stones appear to
have increased mildly in size since 7277.  There is no evidence of
gallbladder wall thickening or pericholecystic fluid to suggest
cholecystitis.  There is no evidence for obstruction.  No
ultrasonographic Murphy's sign is elicited.

Common Bile Duct:  0.6 cm in diameter; within normal limits in
caliber.

Liver:  Normal parenchymal echogenicity and echotexture; no focal
lesions identified.  Limited Doppler evaluation demonstrates normal
blood flow within the liver.

IVC:  Unremarkable in appearance.

Pancreas:  Although the pancreas is difficult to visualize in its
entirety due to overlying bowel gas, no focal pancreatic
abnormality is identified.

Spleen:  9.1 cm in length; within normal limits in size and
echotexture.

Right kidney:  10.8 cm in length; normal in size, configuration and
parenchymal echogenicity.  No evidence of mass or hydronephrosis.

Left kidney:  11.3 cm in length; normal in size, configuration and
parenchymal echogenicity.  No evidence of mass or hydronephrosis.

Abdominal Aorta:  Normal in caliber; no aneurysm identified.
IMPRESSION: Cholelithiasis again noted, without evidence for obstruction or
cholecystitis.  One of the two large stones remains lodged at the
neck of the gallbladder, unchanged from 7277.  Both stones have
increased mildly in size since 7277.  No ultrasonographic Murphy's
sign seen.

## 2012-07-15 ENCOUNTER — Emergency Department (HOSPITAL_COMMUNITY)
Admission: EM | Admit: 2012-07-15 | Discharge: 2012-07-16 | Disposition: A | Discharge status: Home / Self Care | Payer: Self-pay | Attending: Emergency Medicine | Admitting: Emergency Medicine

## 2012-07-15 ENCOUNTER — Encounter (HOSPITAL_COMMUNITY): Payer: Self-pay | Admitting: Emergency Medicine

## 2012-07-15 DIAGNOSIS — I10 Essential (primary) hypertension: Secondary | ICD-10-CM | POA: Insufficient documentation

## 2012-07-15 DIAGNOSIS — K8020 Calculus of gallbladder without cholecystitis without obstruction: Secondary | ICD-10-CM | POA: Insufficient documentation

## 2012-07-15 DIAGNOSIS — E079 Disorder of thyroid, unspecified: Secondary | ICD-10-CM | POA: Insufficient documentation

## 2012-07-15 DIAGNOSIS — K8050 Calculus of bile duct without cholangitis or cholecystitis without obstruction: Secondary | ICD-10-CM

## 2012-07-15 LAB — COMPREHENSIVE METABOLIC PANEL
ALT: 13 U/L (ref 0–35)
AST: 18 U/L (ref 0–37)
Alkaline Phosphatase: 80 U/L (ref 39–117)
CO2: 21 mEq/L (ref 19–32)
Calcium: 8.7 mg/dL (ref 8.4–10.5)
Potassium: 3.1 mEq/L — ABNORMAL LOW (ref 3.5–5.1)
Sodium: 136 mEq/L (ref 135–145)
Total Protein: 7.3 g/dL (ref 6.0–8.3)

## 2012-07-15 LAB — URINE MICROSCOPIC-ADD ON

## 2012-07-15 LAB — CBC
Hemoglobin: 14.7 g/dL (ref 12.0–15.0)
MCH: 30.8 pg (ref 26.0–34.0)
MCHC: 35.1 g/dL (ref 30.0–36.0)
Platelets: 210 10*3/uL (ref 150–400)
RBC: 4.78 MIL/uL (ref 3.87–5.11)

## 2012-07-15 LAB — PREGNANCY, URINE: Preg Test, Ur: NEGATIVE

## 2012-07-15 LAB — URINALYSIS, ROUTINE W REFLEX MICROSCOPIC
Bilirubin Urine: NEGATIVE
Glucose, UA: NEGATIVE mg/dL
Specific Gravity, Urine: 1.025 (ref 1.005–1.030)

## 2012-07-15 MED ORDER — HYDROMORPHONE HCL PF 1 MG/ML IJ SOLN
1.0000 mg | Freq: Once | INTRAMUSCULAR | Status: AC
  Administered 2012-07-15: 1 mg via INTRAVENOUS
  Filled 2012-07-15: qty 1

## 2012-07-15 MED ORDER — FENTANYL CITRATE 0.05 MG/ML IJ SOLN
50.0000 ug | INTRAMUSCULAR | Status: AC
  Administered 2012-07-15: 50 ug via INTRAVENOUS
  Filled 2012-07-15: qty 2

## 2012-07-15 NOTE — ED Notes (Signed)
Pt c/o RUQ abd pain x1 day.  Denies nausea, vomiting and diarrhea.  Hx of gallstones.

## 2012-07-16 NOTE — ED Provider Notes (Signed)
History     CSN: [id number]  Arrival date & time 07/15/12  2210   First MD Initiated Contact with Patient 07/15/12 2310      Chief Complaint  Patient presents with  . Abdominal Pain     The history is provided by the patient.   the patient reports developing acute right upper quadrant abdominal pain today without nausea or vomiting.  She has a known history of cholelithiasis.  She has not followed with the general surgeon as she states she is scared about anesthesia.  The patient reports her pain is significantly improved at this time.  She reports the pain is located in the right upper quadrant radiates to her right upper back.  She reports her pain is similar to prior biliary colic .  Her pain was severe and now is significantly improved.  Nothing worsens or improves her symptoms.  Past Medical History  Diagnosis Date  . Gallstone   . Hypertension   . Thyroid disease     History reviewed. No pertinent past surgical history.  No family history on file.  History  Substance Use Topics  . Smoking status: Never Smoker   . Smokeless tobacco: Not on file  . Alcohol Use: No    OB History    Grav Para Term Preterm Abortions TAB SAB Ect Mult Living                  Review of Systems  Gastrointestinal: Positive for abdominal pain.  All other systems reviewed and are negative.    Allergies  Review of patient's allergies indicates no known allergies.  Home Medications   Current Outpatient Rx  Name Route Sig Dispense Refill  . AMLODIPINE BESYLATE 10 MG PO TABS Oral Take 10 mg by mouth daily.    Marland Kitchen HYDROCHLOROTHIAZIDE 25 MG PO TABS Oral Take 25 mg by mouth daily.    Marland Kitchen HYDROCODONE-ACETAMINOPHEN 10-650 MG PO TABS Oral Take 1 tablet by mouth every 6 (six) hours as needed. Pain    . LEVOTHYROXINE SODIUM 100 MCG PO TABS Oral Take 100 mcg by mouth daily.    Marland Kitchen METOPROLOL SUCCINATE ER 50 MG PO TB24 Oral Take 50 mg by mouth daily. Take with or immediately following a meal.       BP 165/97  Pulse 87  Temp 97.8 F (36.6 C) (Oral)  SpO2 98%  LMP 07/08/2012  Physical Exam  Nursing note and vitals reviewed. Constitutional: She is oriented to person, place, and time. She appears well-developed and well-nourished. No distress.  HENT:  Head: Normocephalic and atraumatic.  Eyes: EOM are normal.  Neck: Normal range of motion.  Cardiovascular: Normal rate, regular rhythm and normal heart sounds.   Pulmonary/Chest: Effort normal and breath sounds normal.  Abdominal: Soft. She exhibits no distension. There is no tenderness. There is no rebound and no guarding.  Musculoskeletal: Normal range of motion.  Neurological: She is alert and oriented to person, place, and time.  Skin: Skin is warm and dry.  Psychiatric: She has a normal mood and affect. Judgment normal.    ED Course  Procedures (including critical care time)  Labs Reviewed  COMPREHENSIVE METABOLIC PANEL - Abnormal; Notable for the following:    Potassium 3.1 (*)     Glucose, Bld 116 (*)     All other components within normal limits  URINALYSIS, ROUTINE W REFLEX MICROSCOPIC - Abnormal; Notable for the following:    Hgb urine dipstick TRACE (*)     All  other components within normal limits  CBC  LIPASE, BLOOD  PREGNANCY, URINE  URINE MICROSCOPIC-ADD ON   No results found.   1. Biliary colic   2. Cholelithiases       MDM  The patient's pain is resolved.  She has no tenderness in right upper quadrant.  She will be referred back to general surgery have encouraged close followup otherwise she will continue to have symptoms consistent with biliary colic in the future        Lyanne Co, MD 07/16/12 [phone]

## 2013-04-29 ENCOUNTER — Other Ambulatory Visit (HOSPITAL_COMMUNITY): Payer: Self-pay | Admitting: Family Medicine

## 2013-04-29 DIAGNOSIS — Z1231 Encounter for screening mammogram for malignant neoplasm of breast: Secondary | ICD-10-CM

## 2013-05-17 ENCOUNTER — Ambulatory Visit (HOSPITAL_COMMUNITY)
Admission: RE | Admit: 2013-05-17 | Discharge: 2013-05-17 | Disposition: A | Discharge status: Home / Self Care | Payer: No Typology Code available for payment source | Source: Ambulatory Visit | Attending: Family Medicine | Admitting: Family Medicine

## 2013-05-17 DIAGNOSIS — Z1231 Encounter for screening mammogram for malignant neoplasm of breast: Secondary | ICD-10-CM | POA: Insufficient documentation

## 2013-05-30 DIAGNOSIS — I10 Essential (primary) hypertension: Secondary | ICD-10-CM | POA: Diagnosis present

## 2013-05-30 DIAGNOSIS — E039 Hypothyroidism, unspecified: Secondary | ICD-10-CM | POA: Diagnosis present

## 2013-05-30 DIAGNOSIS — K8000 Calculus of gallbladder with acute cholecystitis without obstruction: Principal | ICD-10-CM | POA: Diagnosis present

## 2013-05-31 ENCOUNTER — Encounter (HOSPITAL_COMMUNITY): Payer: Self-pay | Admitting: Emergency Medicine

## 2013-05-31 ENCOUNTER — Emergency Department (HOSPITAL_COMMUNITY): Payer: Medicaid Other

## 2013-05-31 ENCOUNTER — Inpatient Hospital Stay (HOSPITAL_COMMUNITY)
Admission: EM | Admit: 2013-05-31 | Discharge: 2013-06-02 | DRG: 419 | Disposition: A | Discharge status: Home / Self Care | Payer: Medicaid Other | Attending: General Surgery | Admitting: General Surgery

## 2013-05-31 DIAGNOSIS — K8020 Calculus of gallbladder without cholecystitis without obstruction: Secondary | ICD-10-CM

## 2013-05-31 DIAGNOSIS — I10 Essential (primary) hypertension: Secondary | ICD-10-CM

## 2013-05-31 DIAGNOSIS — K8050 Calculus of bile duct without cholangitis or cholecystitis without obstruction: Secondary | ICD-10-CM | POA: Diagnosis present

## 2013-05-31 DIAGNOSIS — E039 Hypothyroidism, unspecified: Secondary | ICD-10-CM

## 2013-05-31 LAB — CBC WITH DIFFERENTIAL/PLATELET
Basophils Absolute: 0 10*3/uL (ref 0.0–0.1)
Basophils Relative: 0 % (ref 0–1)
Eosinophils Absolute: 0.1 10*3/uL (ref 0.0–0.7)
MCH: 30.8 pg (ref 26.0–34.0)
MCHC: 36 g/dL (ref 30.0–36.0)
Monocytes Relative: 8 % (ref 3–12)
Neutrophils Relative %: 62 % (ref 43–77)
Platelets: 243 10*3/uL (ref 150–400)
RDW: 12.3 % (ref 11.5–15.5)

## 2013-05-31 LAB — COMPREHENSIVE METABOLIC PANEL
AST: 24 U/L (ref 0–37)
Albumin: 3.6 g/dL (ref 3.5–5.2)
Alkaline Phosphatase: 101 U/L (ref 39–117)
BUN: 15 mg/dL (ref 6–23)
Potassium: 3.7 mEq/L (ref 3.5–5.1)
Sodium: 138 mEq/L (ref 135–145)
Total Protein: 7.6 g/dL (ref 6.0–8.3)

## 2013-05-31 LAB — URINALYSIS, ROUTINE W REFLEX MICROSCOPIC
Glucose, UA: NEGATIVE mg/dL
Ketones, ur: NEGATIVE mg/dL
Leukocytes, UA: NEGATIVE
pH: 5 (ref 5.0–8.0)

## 2013-05-31 LAB — URINE MICROSCOPIC-ADD ON

## 2013-05-31 LAB — POCT PREGNANCY, URINE: Preg Test, Ur: NEGATIVE

## 2013-05-31 MED ORDER — KCL IN DEXTROSE-NACL 20-5-0.45 MEQ/L-%-% IV SOLN
INTRAVENOUS | Status: DC
  Administered 2013-05-31: 23:00:00 via INTRAVENOUS
  Administered 2013-05-31: 100 mL/h via INTRAVENOUS
  Administered 2013-06-01 – 2013-06-02 (×2): via INTRAVENOUS
  Filled 2013-05-31 (×6): qty 1000

## 2013-05-31 MED ORDER — METOPROLOL SUCCINATE ER 50 MG PO TB24
50.0000 mg | ORAL_TABLET | Freq: Every day | ORAL | Status: DC
  Administered 2013-05-31 – 2013-06-02 (×3): 50 mg via ORAL
  Filled 2013-05-31 (×3): qty 1

## 2013-05-31 MED ORDER — DIPHENHYDRAMINE HCL 12.5 MG/5ML PO ELIX: 12.5000 mg | ORAL_SOLUTION | Freq: Four times a day (QID) | ORAL | Status: DC | PRN

## 2013-05-31 MED ORDER — HYDROCHLOROTHIAZIDE 25 MG PO TABS
25.0000 mg | ORAL_TABLET | Freq: Every day | ORAL | Status: DC
  Administered 2013-05-31 – 2013-06-02 (×3): 25 mg via ORAL
  Filled 2013-05-31 (×3): qty 1

## 2013-05-31 MED ORDER — MORPHINE SULFATE 4 MG/ML IJ SOLN
4.0000 mg | Freq: Once | INTRAMUSCULAR | Status: DC
  Filled 2013-05-31: qty 1

## 2013-05-31 MED ORDER — PANTOPRAZOLE SODIUM 40 MG IV SOLR
40.0000 mg | Freq: Every day | INTRAVENOUS | Status: DC
  Administered 2013-05-31 – 2013-06-01 (×2): 40 mg via INTRAVENOUS
  Filled 2013-05-31 (×3): qty 40

## 2013-05-31 MED ORDER — ONDANSETRON HCL 4 MG/2ML IJ SOLN
4.0000 mg | Freq: Once | INTRAMUSCULAR | Status: AC
  Administered 2013-05-31: 4 mg via INTRAVENOUS
  Filled 2013-05-31: qty 2

## 2013-05-31 MED ORDER — ACETAMINOPHEN 325 MG PO TABS: 650.0000 mg | ORAL_TABLET | Freq: Four times a day (QID) | ORAL | Status: DC | PRN

## 2013-05-31 MED ORDER — ONDANSETRON HCL 4 MG/2ML IJ SOLN
4.0000 mg | Freq: Four times a day (QID) | INTRAMUSCULAR | Status: DC | PRN
  Administered 2013-05-31 (×2): 4 mg via INTRAVENOUS
  Filled 2013-05-31 (×2): qty 2

## 2013-05-31 MED ORDER — MORPHINE SULFATE 4 MG/ML IJ SOLN
4.0000 mg | Freq: Once | INTRAMUSCULAR | Status: AC
  Administered 2013-05-31: 4 mg via INTRAVENOUS

## 2013-05-31 MED ORDER — ACETAMINOPHEN 650 MG RE SUPP: 650.0000 mg | Freq: Four times a day (QID) | RECTAL | Status: DC | PRN

## 2013-05-31 MED ORDER — MORPHINE SULFATE 4 MG/ML IJ SOLN
4.0000 mg | Freq: Once | INTRAMUSCULAR | Status: AC
  Administered 2013-05-31: 4 mg via INTRAVENOUS
  Filled 2013-05-31: qty 1

## 2013-05-31 MED ORDER — LEVOTHYROXINE SODIUM 112 MCG PO TABS
112.0000 ug | ORAL_TABLET | Freq: Every day | ORAL | Status: DC
  Administered 2013-05-31 – 2013-06-02 (×3): 112 ug via ORAL
  Filled 2013-05-31 (×5): qty 1

## 2013-05-31 MED ORDER — CIPROFLOXACIN IN D5W 400 MG/200ML IV SOLN
400.0000 mg | Freq: Two times a day (BID) | INTRAVENOUS | Status: DC
  Administered 2013-05-31 – 2013-06-02 (×5): 400 mg via INTRAVENOUS
  Filled 2013-05-31 (×6): qty 200

## 2013-05-31 MED ORDER — HYDROMORPHONE HCL PF 1 MG/ML IJ SOLN
0.5000 mg | INTRAMUSCULAR | Status: DC | PRN
  Administered 2013-05-31 – 2013-06-02 (×4): 1 mg via INTRAVENOUS
  Filled 2013-05-31 (×2): qty 1
  Filled 2013-05-31: qty 2
  Filled 2013-05-31: qty 1

## 2013-05-31 MED ORDER — DIPHENHYDRAMINE HCL 50 MG/ML IJ SOLN: 12.5000 mg | Freq: Four times a day (QID) | INTRAMUSCULAR | Status: DC | PRN

## 2013-05-31 NOTE — ED Notes (Signed)
Patient is nauseated, Information systems manager notified.

## 2013-05-31 NOTE — H&P (Signed)
Deborah Aguirre 1966-11-15  [id number].   Chief Complaint/Reason for Consult: abdominal pain HPI: This is a 47 yo Hispanic female who ate [place] food yesterday for lunch and developed RUQ abdominal pain last night around 9.  She has had 2 episodes of emesis since that time.  She has had similar episodes in the past and has been referred to our office secondary to gallstones, but has never followed up.  All of her labs here are normal, but she has an ultrasound now that shows a gallstone is lodged in the neck of the gallbladder.  We have been asked to see her for further evaluation.   Review of Systems: Please see HPI, otherwise all other systems have been reviewed and are negative.  No family history on file.  Past Medical History  Diagnosis Date  . Gallstone   . Hypertension   . Thyroid disease     History reviewed. No pertinent past surgical history.  Social History:  reports that she has never smoked. She does not have any smokeless tobacco history on file. She reports that she does not drink alcohol or use illicit drugs.  Allergies: No Known Allergies   (Not in a hospital admission)  Blood pressure 147/74, pulse 67, temperature 98.4 F (36.9 C), temperature source Oral, resp. rate 18, height 5\' 1"  (1.549 m), weight 142 lb (64.411 kg), last menstrual period 05/05/2013, SpO2 96.00%. Physical Exam: General: pleasant, WD, WN Hispanic female who is laying in bed in NAD HEENT: head is normocephalic, atraumatic.  Sclera are noninjected.  PERRL.  Ears and nose without any masses or lesions.  Mouth is pink and moist Heart: regular, rate, and rhythm.  Normal s1,s2. No obvious murmurs, gallops, or rubs noted.  Palpable radial and pedal pulses bilaterally Lungs: CTAB, no wheezes, rhonchi, or rales noted.  Respiratory effort nonlabored Abd: soft, mildly tender in RUQ and epigastrium, negative Murphy's sign, ND, +BS, no masses, hernias, or organomegaly MS: all 4 extremities are  symmetrical with no cyanosis, clubbing, or edema. Skin: warm and dry with no masses, lesions, or rashes Psych: A&Ox3 with an appropriate affect.    Results for orders placed during the hospital encounter of 05/31/13 (from the past 48 hour(s))  CBC WITH DIFFERENTIAL     Status: None   Collection Time    05/31/13 12:29 AM      Result Value Range   WBC 6.9  4.0 - 10.5 K/uL   RBC 4.81  3.87 - 5.11 MIL/uL   Hemoglobin 14.8  12.0 - 15.0 g/dL   HCT 40.9  81.1 - 91.4 %   MCV 85.4  78.0 - 100.0 fL   MCH 30.8  26.0 - 34.0 pg   MCHC 36.0  30.0 - 36.0 g/dL   RDW 78.2  95.6 - 21.3 %   Platelets 243  150 - 400 K/uL   Neutrophils Relative % 62  43 - 77 %   Neutro Abs 4.3  1.7 - 7.7 K/uL   Lymphocytes Relative 28  12 - 46 %   Lymphs Abs 1.9  0.7 - 4.0 K/uL   Monocytes Relative 8  3 - 12 %   Monocytes Absolute 0.5  0.1 - 1.0 K/uL   Eosinophils Relative 2  0 - 5 %   Eosinophils Absolute 0.1  0.0 - 0.7 K/uL   Basophils Relative 0  0 - 1 %   Basophils Absolute 0.0  0.0 - 0.1 K/uL  COMPREHENSIVE METABOLIC PANEL     Status: Abnormal  Collection Time    05/31/13 12:29 AM      Result Value Range   Sodium 138  135 - 145 mEq/L   Potassium 3.7  3.5 - 5.1 mEq/L   Chloride 107  96 - 112 mEq/L   CO2 24  19 - 32 mEq/L   Glucose, Bld 119 (*) 70 - 99 mg/dL   BUN 15  6 - 23 mg/dL   Creatinine, Ser 1.61  0.50 - 1.10 mg/dL   Calcium 9.0  8.4 - 09.6 mg/dL   Total Protein 7.6  6.0 - 8.3 g/dL   Albumin 3.6  3.5 - 5.2 g/dL   AST 24  0 - 37 U/L   ALT 39 (*) 0 - 35 U/L   Alkaline Phosphatase 101  39 - 117 U/L   Total Bilirubin 0.2 (*) 0.3 - 1.2 mg/dL   GFR calc non Af Amer >90  >90 mL/min   GFR calc Af Amer >90  >90 mL/min   Comment:            The eGFR has been calculated     using the CKD EPI equation.     This calculation has not been     validated in all clinical     situations.     eGFR's persistently     <90 mL/min signify     possible Chronic Kidney Disease.  URINALYSIS, ROUTINE W REFLEX  MICROSCOPIC     Status: Abnormal   Collection Time    05/31/13  1:04 AM      Result Value Range   Color, Urine YELLOW  YELLOW   APPearance CLOUDY (*) CLEAR   Specific Gravity, Urine 1.024  1.005 - 1.030   pH 5.0  5.0 - 8.0   Glucose, UA NEGATIVE  NEGATIVE mg/dL   Hgb urine dipstick MODERATE (*) NEGATIVE   Bilirubin Urine NEGATIVE  NEGATIVE   Ketones, ur NEGATIVE  NEGATIVE mg/dL   Protein, ur NEGATIVE  NEGATIVE mg/dL   Urobilinogen, UA 0.2  0.0 - 1.0 mg/dL   Nitrite NEGATIVE  NEGATIVE   Leukocytes, UA NEGATIVE  NEGATIVE  URINE MICROSCOPIC-ADD ON     Status: None   Collection Time    05/31/13  1:04 AM      Result Value Range   Squamous Epithelial / LPF RARE  RARE   WBC, UA 0-2  <3 WBC/hpf   RBC / HPF 0-2  <3 RBC/hpf   Bacteria, UA RARE  RARE   Urine-Other MUCOUS PRESENT    POCT PREGNANCY, URINE     Status: None   Collection Time    05/31/13  1:10 AM      Result Value Range   Preg Test, Ur NEGATIVE  NEGATIVE   Comment:            THE SENSITIVITY OF THIS     METHODOLOGY IS >24 mIU/mL   US Abdomen Complete  05/31/2013   *RADIOLOGY REPORT*  Clinical Data:  Abdominal pain.  History of cholelithiasis and hypertension.  COMPLETE ABDOMINAL ULTRASOUND  Comparison:  Abdominal ultrasound 02/09/2012 and 11/09/2010.  Findings:  Gallbladder: Several sizable gallstones are again noted, including a 1.6 cm gallstone within the gallbladder neck.  There is no gallbladder wall thickening or sonographic Murphy's sign.  Common bile duct:   Normal in caliber without filling defects.  Liver:  Echogenicity is within normal limits.  No focal hepatic abnormalities are identified.  IVC:  Visualized portions appear unremarkable.  Pancreas:  Visualized portions  appear unremarkable.  Spleen:  Visualized portions appear unremarkable.  Right Kidney:   The renal cortical thickness and echogenicity are preserved.  There is no hydronephrosis or focal abnormality. Renal length is 10.6 cm.  Left Kidney:   The renal  cortical thickness and echogenicity are preserved.  There is no hydronephrosis or focal abnormality. Renal length is 11.9 cm.  Abdominal aorta:  Visualized portions appear unremarkable.  IMPRESSION: Overall stable examination.  Cholelithiasis without evidence of cholecystitis or biliary dilatation.   Original Report Authenticated By: Carey Bullocks, M.D.       Assessment/Plan 1. Biliary colic 2. HTN 3. Hypothyroidism  Plan: 1. We will get this patient admitted for surgical intervention.  Unfortunately, our OR is booked today, so we will get her pain control today and plan for surgical intervention tomorrow.  I have d/w the patient via her daughter as the patient does not speak [place], but her daughter does.  We will resume her home meds and give clear liquids today.  NPO p MN tonight for OR tomorrow.  Aylinn Rydberg E 05/31/2013, 8:45 AM Pager: [phone]

## 2013-05-31 NOTE — ED Notes (Signed)
Pt alert, arrives from home, c/o right lower quad pain, onset was today, describes as dull radiating to umbilicus, resp even unlabored, skin pwd, denies changes in bowel or bladder

## 2013-05-31 NOTE — Progress Notes (Signed)
[facility] ED CM spoke with pt and female family members x 3 at her bedside Reports pt's pcp is Jeanella Flattery  Pt reports via translation by one of the female family members that she feels nauseated and "needs to throw up" CM assisted to raise hob, apply a cool cloth to pr forehead and provide emesis container. CM found pt's call bell near one of the female visitor and reviewed its use to contact nursing staff Call bell then placed near pt. CM informed nursing staff of pt concerns after leaving pt's room No emesis noted prior to CM leaving pt's room. One of female family members stated pt was recently provided with nausea medicine

## 2013-05-31 NOTE — ED Provider Notes (Signed)
History     CSN: [id number]  Arrival date & time 05/30/13  2359   First MD Initiated Contact with Patient 05/31/13 0244      Chief Complaint  Patient presents with  . Abdominal Pain    (Consider location/radiation/quality/duration/timing/severity/associated sxs/prior treatment) Patient is a 47 y.o. female presenting with abdominal pain. The history is provided by the patient.  Abdominal Pain Associated symptoms include abdominal pain.  She had onset at about 9 PM of severe right upper quadrant pain. She rated the pain at 8/10. Nothing makes it better nothing makes it worse. Pain started after eating some [place] food. There is has been no nausea or vomiting. She denies fever, chills, sweats. She has been diagnosed with gallstones in the past but apparently has not followed up with seeing a Careers adviser.  Past Medical History  Diagnosis Date  . Gallstone   . Hypertension   . Thyroid disease     History reviewed. No pertinent past surgical history.  No family history on file.  History  Substance Use Topics  . Smoking status: Never Smoker   . Smokeless tobacco: Not on file  . Alcohol Use: No    OB History   Grav Para Term Preterm Abortions TAB SAB Ect Mult Living                  Review of Systems  Gastrointestinal: Positive for abdominal pain.  All other systems reviewed and are negative.    Allergies  Review of patient's allergies indicates no known allergies.  Home Medications   Current Outpatient Rx  Name  Route  Sig  Dispense  Refill  . acetaminophen (TYLENOL) 500 MG tablet   Oral   Take 1,000 mg by mouth every 6 (six) hours as needed for pain.         . hydrochlorothiazide (HYDRODIURIL) 25 MG tablet   Oral   Take 25 mg by mouth daily.         Marland Kitchen levothyroxine (SYNTHROID, LEVOTHROID) 112 MCG tablet   Oral   Take 112 mcg by mouth daily before breakfast.         . metoprolol succinate (TOPROL-XL) 50 MG 24 hr tablet   Oral   Take 50 mg by mouth  daily. Take with or immediately following a meal.           BP 141/90  Pulse 94  Temp(Src) 98.6 F (37 C) (Oral)  Resp 18  Ht 5\' 1"  (1.549 m)  Wt 142 lb (64.411 kg)  BMI 26.84 kg/m2  SpO2 98%  LMP 05/05/2013  Physical Exam  Nursing note and vitals reviewed.  47 year old female, resting comfortably and in no acute distress. Vital signs are significant for borderline hypertension with blood pressure 141/90. Oxygen saturation is 98%, which is normal. Head is normocephalic and atraumatic. PERRLA, EOMI. Oropharynx is clear. Neck is nontender and supple without adenopathy or JVD. Back is nontender and there is no CVA tenderness. Lungs are clear without rales, wheezes, or rhonchi. Chest is nontender. Heart has regular rate and rhythm without murmur. Abdomen is soft, flat, with moderate right upper quadrant tenderness and a plus/minus Murphy sign. There is no rebound or guarding masses or hepatosplenomegaly and peristalsis is hypoactive. Extremities have no cyanosis or edema, full range of motion is present. Skin is warm and dry without rash. Neurologic: Mental status is normal, cranial nerves are intact, there are no motor or sensory deficits.  ED Course  Procedures (including critical care  time)  Results for orders placed during the hospital encounter of 05/31/13  CBC WITH DIFFERENTIAL      Result Value Range   WBC 6.9  4.0 - 10.5 K/uL   RBC 4.81  3.87 - 5.11 MIL/uL   Hemoglobin 14.8  12.0 - 15.0 g/dL   HCT 40.9  81.1 - 91.4 %   MCV 85.4  78.0 - 100.0 fL   MCH 30.8  26.0 - 34.0 pg   MCHC 36.0  30.0 - 36.0 g/dL   RDW 78.2  95.6 - 21.3 %   Platelets 243  150 - 400 K/uL   Neutrophils Relative % 62  43 - 77 %   Neutro Abs 4.3  1.7 - 7.7 K/uL   Lymphocytes Relative 28  12 - 46 %   Lymphs Abs 1.9  0.7 - 4.0 K/uL   Monocytes Relative 8  3 - 12 %   Monocytes Absolute 0.5  0.1 - 1.0 K/uL   Eosinophils Relative 2  0 - 5 %   Eosinophils Absolute 0.1  0.0 - 0.7 K/uL   Basophils  Relative 0  0 - 1 %   Basophils Absolute 0.0  0.0 - 0.1 K/uL  COMPREHENSIVE METABOLIC PANEL      Result Value Range   Sodium 138  135 - 145 mEq/L   Potassium 3.7  3.5 - 5.1 mEq/L   Chloride 107  96 - 112 mEq/L   CO2 24  19 - 32 mEq/L   Glucose, Bld 119 (*) 70 - 99 mg/dL   BUN 15  6 - 23 mg/dL   Creatinine, Ser 0.86  0.50 - 1.10 mg/dL   Calcium 9.0  8.4 - 57.8 mg/dL   Total Protein 7.6  6.0 - 8.3 g/dL   Albumin 3.6  3.5 - 5.2 g/dL   AST 24  0 - 37 U/L   ALT 39 (*) 0 - 35 U/L   Alkaline Phosphatase 101  39 - 117 U/L   Total Bilirubin 0.2 (*) 0.3 - 1.2 mg/dL   GFR calc non Af Amer >90  >90 mL/min   GFR calc Af Amer >90  >90 mL/min  URINALYSIS, ROUTINE W REFLEX MICROSCOPIC      Result Value Range   Color, Urine YELLOW  YELLOW   APPearance CLOUDY (*) CLEAR   Specific Gravity, Urine 1.024  1.005 - 1.030   pH 5.0  5.0 - 8.0   Glucose, UA NEGATIVE  NEGATIVE mg/dL   Hgb urine dipstick MODERATE (*) NEGATIVE   Bilirubin Urine NEGATIVE  NEGATIVE   Ketones, ur NEGATIVE  NEGATIVE mg/dL   Protein, ur NEGATIVE  NEGATIVE mg/dL   Urobilinogen, UA 0.2  0.0 - 1.0 mg/dL   Nitrite NEGATIVE  NEGATIVE   Leukocytes, UA NEGATIVE  NEGATIVE  URINE MICROSCOPIC-ADD ON      Result Value Range   Squamous Epithelial / LPF RARE  RARE   WBC, UA 0-2  <3 WBC/hpf   RBC / HPF 0-2  <3 RBC/hpf   Bacteria, UA RARE  RARE   Urine-Other MUCOUS PRESENT    POCT PREGNANCY, URINE      Result Value Range   Preg Test, Ur NEGATIVE  NEGATIVE    1. Biliary colic       MDM  Biliary colic. Old records are reviewed and she has had multiple ultrasounds all showing presence of gallstones. She has been referred to [facility] [place] [facility] but apparently has not followed up. Laboratory workup had been done  and she shows no evidence of cholecystitis. WBC is normal and her only elevated liver enzymes and minimal elevation of ALT to 39. Pain is still moderate in intensity she'll be given additional morphine. She had been given  initial dose of morphine with moderate relief of pain. Once pain is controlled, she will be sent home with prescriptions for Percocet and metoclopramide and, once again, referred to [facility] [place] [facility] for followup. It was stressed to the patient that she will continue to have episodes of biliary colic until her gallbladder is removed.   Pain has been difficult to control. At this point, she has received 16 mg of morphine and is still having some pain. She also required a second dose of ondansetron. Although transaminases are not elevated and WBC is not elevated, surgical consultation has been obtained. Dr. Daphine Deutscher has seen the patient has requested that an abdominal ultrasound be obtained to look for evidence of acute cholecystitis.  Dione Booze, MD 05/31/13 [phone]

## 2013-05-31 NOTE — Progress Notes (Signed)
[facility] has seen patient. Patient does have a [employer] and her PCP is located at [facility] at [place].

## 2013-06-01 ENCOUNTER — Encounter (HOSPITAL_COMMUNITY): Payer: Self-pay | Admitting: *Deleted

## 2013-06-01 ENCOUNTER — Inpatient Hospital Stay (HOSPITAL_COMMUNITY): Payer: Medicaid Other

## 2013-06-01 ENCOUNTER — Inpatient Hospital Stay (HOSPITAL_COMMUNITY): Payer: Medicaid Other | Admitting: *Deleted

## 2013-06-01 ENCOUNTER — Encounter (HOSPITAL_COMMUNITY): Admission: EM | Disposition: A | Discharge status: Home / Self Care | Payer: Self-pay | Source: Home / Self Care

## 2013-06-01 DIAGNOSIS — K8010 Calculus of gallbladder with chronic cholecystitis without obstruction: Secondary | ICD-10-CM

## 2013-06-01 HISTORY — PX: CHOLECYSTECTOMY: SHX55

## 2013-06-01 SURGERY — LAPAROSCOPIC CHOLECYSTECTOMY WITH INTRAOPERATIVE CHOLANGIOGRAM
Anesthesia: General | Site: Abdomen | Wound class: Clean Contaminated

## 2013-06-01 MED ORDER — LACTATED RINGERS IV SOLN
INTRAVENOUS | Status: DC
  Administered 2013-06-01: 13:00:00 via INTRAVENOUS
  Administered 2013-06-01: 1000 mL via INTRAVENOUS

## 2013-06-01 MED ORDER — LACTATED RINGERS IV SOLN: INTRAVENOUS | Status: DC

## 2013-06-01 MED ORDER — HYDROMORPHONE HCL PF 1 MG/ML IJ SOLN: 0.2500 mg | INTRAMUSCULAR | Status: DC | PRN

## 2013-06-01 MED ORDER — ONDANSETRON HCL 4 MG/2ML IJ SOLN
INTRAMUSCULAR | Status: DC | PRN
  Administered 2013-06-01: 4 mg via INTRAVENOUS

## 2013-06-01 MED ORDER — LACTATED RINGERS IV SOLN
INTRAVENOUS | Status: DC | PRN
  Administered 2013-06-01: 1000 mL via INTRAVENOUS

## 2013-06-01 MED ORDER — GLYCOPYRROLATE 0.2 MG/ML IJ SOLN
INTRAMUSCULAR | Status: DC | PRN
  Administered 2013-06-01: 0.6 mg via INTRAVENOUS

## 2013-06-01 MED ORDER — ROCURONIUM BROMIDE 100 MG/10ML IV SOLN
INTRAVENOUS | Status: DC | PRN
  Administered 2013-06-01: 30 mg via INTRAVENOUS
  Administered 2013-06-01: 10 mg via INTRAVENOUS

## 2013-06-01 MED ORDER — IOHEXOL 300 MG/ML  SOLN
INTRAMUSCULAR | Status: DC | PRN
  Administered 2013-06-01: 50 mL via INTRAVENOUS

## 2013-06-01 MED ORDER — DEXAMETHASONE SODIUM PHOSPHATE 4 MG/ML IJ SOLN
INTRAMUSCULAR | Status: DC | PRN
  Administered 2013-06-01: 10 mg via INTRAVENOUS

## 2013-06-01 MED ORDER — NEOSTIGMINE METHYLSULFATE 1 MG/ML IJ SOLN
INTRAMUSCULAR | Status: DC | PRN
  Administered 2013-06-01: 5 mg via INTRAVENOUS

## 2013-06-01 MED ORDER — BUPIVACAINE-EPINEPHRINE 0.25% -1:200000 IJ SOLN
INTRAMUSCULAR | Status: DC | PRN
  Administered 2013-06-01: 30 mL

## 2013-06-01 MED ORDER — HYDROMORPHONE HCL PF 1 MG/ML IJ SOLN
INTRAMUSCULAR | Status: DC | PRN
  Administered 2013-06-01: 2 mg via INTRAVENOUS

## 2013-06-01 MED ORDER — PHENYLEPHRINE HCL 10 MG/ML IJ SOLN
INTRAMUSCULAR | Status: DC | PRN
  Administered 2013-06-01: 80 ug via INTRAVENOUS
  Administered 2013-06-01: 120 ug via INTRAVENOUS
  Administered 2013-06-01 (×2): 80 ug via INTRAVENOUS

## 2013-06-01 MED ORDER — FENTANYL CITRATE 0.05 MG/ML IJ SOLN
INTRAMUSCULAR | Status: DC | PRN
  Administered 2013-06-01: 50 ug via INTRAVENOUS
  Administered 2013-06-01: 25 ug via INTRAVENOUS
  Administered 2013-06-01: 100 ug via INTRAVENOUS
  Administered 2013-06-01: 25 ug via INTRAVENOUS
  Administered 2013-06-01: 50 ug via INTRAVENOUS

## 2013-06-01 MED ORDER — PROPOFOL 10 MG/ML IV BOLUS
INTRAVENOUS | Status: DC | PRN
  Administered 2013-06-01: 100 mg via INTRAVENOUS

## 2013-06-01 MED ORDER — METOCLOPRAMIDE HCL 5 MG/ML IJ SOLN
INTRAMUSCULAR | Status: DC | PRN
  Administered 2013-06-01: 10 mg via INTRAVENOUS

## 2013-06-01 MED ORDER — MIDAZOLAM HCL 5 MG/5ML IJ SOLN
INTRAMUSCULAR | Status: DC | PRN
  Administered 2013-06-01: 2 mg via INTRAVENOUS

## 2013-06-01 MED ORDER — PHENYLEPHRINE HCL 10 MG/ML IJ SOLN
10.0000 mg | INTRAVENOUS | Status: DC | PRN
  Administered 2013-06-01: 30 ug/min via INTRAVENOUS

## 2013-06-01 MED ORDER — PROMETHAZINE HCL 25 MG/ML IJ SOLN: 6.2500 mg | INTRAMUSCULAR | Status: DC | PRN

## 2013-06-01 SURGICAL SUPPLY — 33 items
APPLIER CLIP ROT 10 11.4 M/L (STAPLE) ×2
CANISTER SUCTION 2500CC (MISCELLANEOUS) ×2 IMPLANT
CATH REDDICK CHOLANGI 4FR 50CM (CATHETERS) ×2 IMPLANT
CLIP APPLIE ROT 10 11.4 M/L (STAPLE) ×1 IMPLANT
CLOTH BEACON ORANGE TIMEOUT ST (SAFETY) ×2 IMPLANT
COVER MAYO STAND STRL (DRAPES) ×2 IMPLANT
DECANTER SPIKE VIAL GLASS SM (MISCELLANEOUS) ×2 IMPLANT
DERMABOND ADVANCED (GAUZE/BANDAGES/DRESSINGS) ×1
DERMABOND ADVANCED .7 DNX12 (GAUZE/BANDAGES/DRESSINGS) ×1 IMPLANT
DRAPE C-ARM 42X120 X-RAY (DRAPES) ×2 IMPLANT
DRAPE LAPAROSCOPIC ABDOMINAL (DRAPES) ×2 IMPLANT
DRAPE UTILITY XL STRL (DRAPES) ×2 IMPLANT
ELECT REM PT RETURN 9FT ADLT (ELECTROSURGICAL) ×2
ELECTRODE REM PT RTRN 9FT ADLT (ELECTROSURGICAL) ×1 IMPLANT
GLOVE BIO SURGEON STRL SZ7.5 (GLOVE) ×4 IMPLANT
GLOVE BIOGEL PI IND STRL 7.0 (GLOVE) ×1 IMPLANT
GLOVE BIOGEL PI INDICATOR 7.0 (GLOVE) ×1
GOWN PREVENTION PLUS XLARGE (GOWN DISPOSABLE) ×10 IMPLANT
GOWN STRL NON-REIN LRG LVL3 (GOWN DISPOSABLE) ×2 IMPLANT
GOWN STRL REIN XL XLG (GOWN DISPOSABLE) ×2 IMPLANT
HEMOSTAT SURGICEL 4X8 (HEMOSTASIS) IMPLANT
IV CATH 14GX2 1/4 (CATHETERS) ×2 IMPLANT
KIT BASIN OR (CUSTOM PROCEDURE TRAY) ×2 IMPLANT
POUCH SPECIMEN RETRIEVAL 10MM (ENDOMECHANICALS) ×2 IMPLANT
SET IRRIG TUBING LAPAROSCOPIC (IRRIGATION / IRRIGATOR) ×2 IMPLANT
SOLUTION ANTI FOG 6CC (MISCELLANEOUS) ×2 IMPLANT
SUT MNCRL AB 4-0 PS2 18 (SUTURE) ×2 IMPLANT
TOWEL OR 17X26 10 PK STRL BLUE (TOWEL DISPOSABLE) ×2 IMPLANT
TRAY LAP CHOLE (CUSTOM PROCEDURE TRAY) ×2 IMPLANT
TROCAR BLADELESS OPT 5 75 (ENDOMECHANICALS) ×4 IMPLANT
TROCAR XCEL BLUNT TIP 100MML (ENDOMECHANICALS) ×2 IMPLANT
TROCAR XCEL NON-BLD 11X100MML (ENDOMECHANICALS) ×2 IMPLANT
TUBING INSUFFLATION 10FT LAP (TUBING) ×2 IMPLANT

## 2013-06-01 NOTE — Transfer of Care (Signed)
Immediate Anesthesia Transfer of Care Note  Patient: Deborah Aguirre  Procedure(s) Performed: Procedure(s): LAPAROSCOPIC CHOLECYSTECTOMY WITH INTRAOPERATIVE CHOLANGIOGRAM (N/A)  Patient Location: PACU  Anesthesia Type:General  Level of Consciousness: awake, alert , oriented, unresponsive and responds to stimulation  Airway & Oxygen Therapy: Patient Spontanous Breathing and Patient connected to face mask oxygen  Post-op Assessment: Report given to PACU RN, Post -op Vital signs reviewed and stable and Patient moving all extremities X 4  Post vital signs: Reviewed and stable  Complications: No apparent anesthesia complications

## 2013-06-01 NOTE — Anesthesia Preprocedure Evaluation (Signed)
Anesthesia Evaluation  Patient identified by MRN, date of birth, ID band Patient awake    Reviewed: Allergy & Precautions, H&P , NPO status , Patient's Chart, lab work & pertinent test results  Airway Mallampati: II TM Distance: >3 FB Neck ROM: Full    Dental  (+) Teeth Intact, Caps and Dental Advisory Given   Pulmonary neg pulmonary ROS,  breath sounds clear to auscultation  Pulmonary exam normal       Cardiovascular hypertension, Rhythm:Regular Rate:Normal     Neuro/Psych negative neurological ROS  negative psych ROS   GI/Hepatic negative GI ROS, Neg liver ROS,   Endo/Other  Hypothyroidism   Renal/GU negative Renal ROS  negative genitourinary   Musculoskeletal negative musculoskeletal ROS (+)   Abdominal   Peds  Hematology negative hematology ROS (+)   Anesthesia Other Findings Bridge/partial upper incisors  Reproductive/Obstetrics                           Anesthesia Physical Anesthesia Plan  ASA: II  Anesthesia Plan: General   Post-op Pain Management:    Induction: Intravenous  Airway Management Planned: Oral ETT  Additional Equipment:   Intra-op Plan:   Post-operative Plan: Extubation in OR  Informed Consent: I have reviewed the patients History and Physical, chart, labs and discussed the procedure including the risks, benefits and alternatives for the proposed anesthesia with the patient or authorized representative who has indicated his/her understanding and acceptance.   Dental advisory given  Plan Discussed with: CRNA  Anesthesia Plan Comments:         Anesthesia Quick Evaluation

## 2013-06-01 NOTE — Anesthesia Postprocedure Evaluation (Signed)
Anesthesia Post Note  Patient: Deborah Aguirre  Procedure(s) Performed: Procedure(s) (LRB): LAPAROSCOPIC CHOLECYSTECTOMY WITH INTRAOPERATIVE CHOLANGIOGRAM (N/A)  Anesthesia type: General  Patient location: PACU  Post pain: Pain level controlled  Post assessment: Post-op Vital signs reviewed  Last Vitals:  Filed Vitals:   06/01/13 1345  BP: 133/79  Pulse: 89  Temp: 36.9 C  Resp: 13    Post vital signs: Reviewed  Level of consciousness: sedated  Complications: No apparent anesthesia complications

## 2013-06-01 NOTE — Op Note (Signed)
05/31/2013 - 06/01/2013  1:17 PM  PATIENT:  Genella Rife Romero-Hernandez  47 y.o. female  PRE-OPERATIVE DIAGNOSIS:  gallstones  POST-OPERATIVE DIAGNOSIS:  gallstones  PROCEDURE:  Procedure(s): LAPAROSCOPIC CHOLECYSTECTOMY WITH INTRAOPERATIVE CHOLANGIOGRAM (N/A)  SURGEON:  Surgeon(s) and Role:    * Robyne Askew, MD - Primary  PHYSICIAN ASSISTANT:   ASSISTANTS: none   ANESTHESIA:   general  EBL:  Total I/O In: 1000 [I.V.:1000] Out: -   BLOOD ADMINISTERED:none  DRAINS: none   LOCAL MEDICATIONS USED:  MARCAINE     SPECIMEN:  Source of Specimen:  gallbladder  DISPOSITION OF SPECIMEN:  PATHOLOGY  COUNTS:  YES  TOURNIQUET:  * No tourniquets in log *  DICTATION: .Dragon Dictation @opnoteheader @  Procedure: After informed consent was obtained the patient was brought to the operating room and placed in the supine position on the operating room table. After adequate induction of general anesthesia the patient's abdomen was prepped with ChloraPrep allowed to dry and draped in usual sterile manner. The area below the umbilicus was infiltrated with quarter percent  Marcaine. A small incision was made with a 15 blade knife. The incision was carried down through the subcutaneous tissue bluntly with a hemostat and Army-Navy retractors. The linea alba was identified. The linea alba was incised with a 15 blade knife and each side was grasped with Coker clamps. The preperitoneal space was then probed with a hemostat until the peritoneum was opened and access was gained to the abdominal cavity. A 0 Vicryl pursestring stitch was placed in the fascia surrounding the opening. A Hassan cannula was then placed through the opening and anchored in place with the previously placed Vicryl purse string stitch. The abdomen was insufflated with carbon dioxide without difficulty. A laparoscope was inserted through the [facility] cannula in the right upper quadrant was inspected. Next the epigastric region was  infiltrated with % Marcaine. A small incision was made with a 15 blade knife. A 10 mm port was placed bluntly through this incision into the abdominal cavity under direct vision. Next 2 sites were chosen laterally on the right side of the abdomen for placement of 5 mm ports. Each of these areas was infiltrated with quarter percent Marcaine. Small stab incisions were made with a 15 blade knife. 5 mm ports were then placed bluntly through these incisions into the abdominal cavity under direct vision without difficulty. A blunt grasper was placed through the lateralmost 5 mm port and used to grasp the dome of the gallbladder and elevated anteriorly and superiorly. The gallbladder was very tense and inflamed and had to be aspirated before it could be grasped. Another blunt grasper was placed through the other 5 mm port and used to retract the body and neck of the gallbladder. A dissector was placed through the epigastric port and using the electrocautery the peritoneal reflection at the gallbladder neck was opened. Blunt dissection was then carried out in this area until the gallbladder neck-cystic duct junction was readily identified and a good window was created. A single clip was placed on the gallbladder neck. A small  ductotomy was made just below the clip with laparoscopic scissors. A 14-gauge Angiocath was then placed through the anterior abdominal wall under direct vision. A Reddick cholangiogram catheter was then placed through the Angiocath and flushed. The catheter was then placed in the cystic duct and anchored in place with a clip. A cholangiogram was obtained that showed no filling defects good emptying into the duodenum an adequate length on the  cystic duct. The anchoring clip and catheters were then removed from the patient. 3 clips were placed proximally on the cystic duct and the duct was divided between the 2 sets of clips. Posterior to this the cystic artery was identified and again dissected  bluntly in a circumferential manner until a good window  was created. 2 clips were placed proximally and one distally on the artery and the artery was divided between the 2 sets of clips. Next a laparoscopic hook cautery device was used to separate the gallbladder from the liver bed. Prior to completely detaching the gallbladder from the liver bed the liver bed was inspected and several small bleeding points were coagulated with the electrocautery until the area was completely hemostatic. The gallbladder was then detached the rest of it from the liver bed without difficulty. A laparoscopic bag was inserted through the epigastric port. The gallbladder was placed within the bag and the bag was sealed. A laparoscope was then moved to the epigastric port. The gallbladder grasper was placed through the [facility] cannula and used to grasp the opening of the bag. The bag with the gallbladder was then removed with the [facility] cannula through the infraumbilical port without difficulty. The fascial defect was then closed with the previously placed Vicryl pursestring stitch as well as with another figure-of-eight 0 Vicryl stitch. The liver bed was inspected again and found to be hemostatic. The abdomen was irrigated with copious amounts of saline until the effluent was clear. The ports were then removed under direct vision without difficulty and were found to be hemostatic. The gas was allowed to escape. The skin incisions were all closed with interrupted 4-0 Monocryl subcuticular stitches. Dermabond dressings were applied. The patient tolerated the procedure well. At the end of the case all needle sponge and instrument counts were correct. The patient was then awakened and taken to recovery in stable condition  PLAN OF CARE: Admit to inpatient   PATIENT DISPOSITION:  PACU - hemodynamically stable.   Delay start of Pharmacological VTE agent (>24hrs) due to surgical blood loss or risk of bleeding: yes

## 2013-06-01 NOTE — Preoperative (Signed)
Beta Blockers   Reason not to administer Beta Blockers:Not Applicable, pt received BB this am at 0940

## 2013-06-01 NOTE — Interval H&P Note (Signed)
History and Physical Interval Note:  06/01/2013 11:19 AM  Deborah Aguirre  has presented today for surgery, with the diagnosis of gallstones  The various methods of treatment have been discussed with the patient and family. After consideration of risks, benefits and other options for treatment, the patient has consented to  Procedure(s): LAPAROSCOPIC CHOLECYSTECTOMY WITH INTRAOPERATIVE CHOLANGIOGRAM (N/A) as a surgical intervention .  The patient's history has been reviewed, patient examined, no change in status, stable for surgery.  I have reviewed the patient's chart and labs.  Questions were answered to the patient's satisfaction.     TOTH III,PAUL S

## 2013-06-02 ENCOUNTER — Encounter (HOSPITAL_COMMUNITY): Payer: Self-pay | Admitting: General Surgery

## 2013-06-02 MED ORDER — OXYCODONE HCL 5 MG PO TABS: 5.0000 mg | ORAL_TABLET | ORAL | Status: DC | PRN

## 2013-06-02 NOTE — Progress Notes (Signed)
Patient and family were given discharge instructions including prescription for pain medication along with follow up appointment. Patient has been given incision care instructions along with indicators for calling the doctor prior to scheduled follow up appointment. Patient was also provide a doctor's note on when to return to work. Patient and family verbalized understanding of discharge instructions with teach back.

## 2013-06-02 NOTE — Discharge Summary (Signed)
Patient ID: Deborah Aguirre MRN: [id number] DOB/AGE: 46/09/1966 46 y.o.  Admit date: 05/31/2013 Discharge date: 06/02/2013  Procedures: lap chole with IOC  Consults: None  Reason for Admission: This is a 47 yo Hispanic female who ate [place] food yesterday for lunch and developed RUQ abdominal pain last night around 9. She has had 2 episodes of emesis since that time. She has had similar episodes in the past and has been referred to our office secondary to gallstones, but has never followed up. All of her labs here are normal, but she has an ultrasound now that shows a gallstone is lodged in the neck of the gallbladder. We have been asked to see her for further evaluation.   Admission Diagnoses:  1. Biliary colic  Hospital Course: The patient was admitted and taken to the OR the following day for a lap chole.  She tolerated the procedure well.  She was noted to have some early cholecystitis.  Postoperatively, she did well.  Her pain was controlled and she was tolerating a solid diet.  She was stable for dc home.  PE: Abd: soft, essentially NT, ND, +BS, incisions, c/d/i   Discharge Diagnoses:  Principal Problem:   Biliary colic Active Problems:   HTN (hypertension)   Hypothyroid early acute cholecystitis S/p lap chole  Discharge Medications:   Medication List    TAKE these medications       acetaminophen 500 MG tablet  Commonly known as:  TYLENOL  Take 1,000 mg by mouth every 6 (six) hours as needed for pain.     hydrochlorothiazide 25 MG tablet  Commonly known as:  HYDRODIURIL  Take 25 mg by mouth daily.     levothyroxine 112 MCG tablet  Commonly known as:  SYNTHROID, LEVOTHROID  Take 112 mcg by mouth daily before breakfast.     metoprolol succinate 50 MG 24 hr tablet  Commonly known as:  TOPROL-XL  Take 50 mg by mouth daily. Take with or immediately following a meal.     oxyCODONE 5 MG immediate release tablet  Commonly known as:  ROXICODONE  Take 1-2  tablets (5-10 mg total) by mouth every 4 (four) hours as needed for pain.        Discharge Instructions:     Follow-up Information   Follow up with [facility] On 06/22/2013. (11:00am, arrive at 10:45am)    Contact information:   [address]   [place] [address] [phone]       Signed: Letha Cape 06/02/2013, 8:57 AM

## 2013-06-22 ENCOUNTER — Encounter (INDEPENDENT_AMBULATORY_CARE_PROVIDER_SITE_OTHER): Payer: PRIVATE HEALTH INSURANCE | Admitting: General Surgery

## 2013-06-22 ENCOUNTER — Ambulatory Visit (INDEPENDENT_AMBULATORY_CARE_PROVIDER_SITE_OTHER): Payer: PRIVATE HEALTH INSURANCE | Admitting: Internal Medicine

## 2013-06-22 ENCOUNTER — Encounter (INDEPENDENT_AMBULATORY_CARE_PROVIDER_SITE_OTHER): Payer: Self-pay | Admitting: Internal Medicine

## 2013-06-22 ENCOUNTER — Encounter (INDEPENDENT_AMBULATORY_CARE_PROVIDER_SITE_OTHER): Payer: Self-pay | Admitting: General Surgery

## 2013-06-22 VITALS — BP 132/74 | HR 78 | Temp 98.9°F | Resp 18 | Ht 60.0 in | Wt 131.0 lb

## 2013-06-22 DIAGNOSIS — K8020 Calculus of gallbladder without cholecystitis without obstruction: Secondary | ICD-10-CM

## 2013-06-22 NOTE — Patient Instructions (Addendum)
Neosporin and dry dressing over the wound and change twice a day for 3-4 days.  May leave open to air after that. May resume regular activity without restrictions. Follow up as needed. Call with questions or concerns.

## 2013-06-22 NOTE — Progress Notes (Signed)
  Subjective: Pt returns to the clinic today after undergoing laparoscopic cholecystectomy on 06/01/13 by Dr. Carolynne Edouard.  The patient is tolerating their diet well and is having no severe pain.  Bowel function is good.  Small skin opening on one of the lateral incisions but no signs of infection.  She is feeling a little weak.  She is not back at work.  Objective: Vital signs in last 24 hours: Reviewed  PE: Abd: soft, non-tender, +bs, incisions well healed except small opening on one of the right lateral wounds  Lab Results:  No results found for this basename: WBC, HGB, HCT, PLT,  in the last 72 hours BMET No results found for this basename: NA, K, CL, CO2, GLUCOSE, BUN, CREATININE, CALCIUM,  in the last 72 hours PT/INR No results found for this basename: LABPROT, INR,  in the last 72 hours CMP     Component Value Date/Time   NA 138 05/31/2013 0029   K 3.7 05/31/2013 0029   CL 107 05/31/2013 0029   CO2 24 05/31/2013 0029   GLUCOSE 119* 05/31/2013 0029   BUN 15 05/31/2013 0029   CREATININE 0.71 05/31/2013 0029   CALCIUM 9.0 05/31/2013 0029   PROT 7.6 05/31/2013 0029   ALBUMIN 3.6 05/31/2013 0029   AST 24 05/31/2013 0029   ALT 39* 05/31/2013 0029   ALKPHOS 101 05/31/2013 0029   BILITOT 0.2* 05/31/2013 0029   GFRNONAA >90 05/31/2013 0029   GFRAA >90 05/31/2013 0029   Lipase     Component Value Date/Time   LIPASE 32 07/15/2012 2328       Studies/Results: No results found.  Anti-infectives: Anti-infectives   None       Assessment/Plan  1.  S/P Laparoscopic Cholecystectomy: doing well, may resume regular activity without restrictions, neosporin and dry dressing over the wound and change twice a day for 3-4 days. Pt will follow up with [place] PR[facility] and knows to call with questions or concerns.     Indianna Boran 06/22/2013

## 2013-08-08 IMAGING — US US ABDOMEN COMPLETE
1 series · 14 of 25 positions shown · non-contrast
Comparison: Abdominal ultrasound 02/09/2012 and 11/09/2010.

CLINICAL DATA: Abdominal pain.  History of cholelithiasis and
hypertension.

COMPLETE ABDOMINAL ULTRASOUND

[Series 1: us abdomen complete · 0.32mm/px · 14 of 77 slices shown]
[im 1/77]
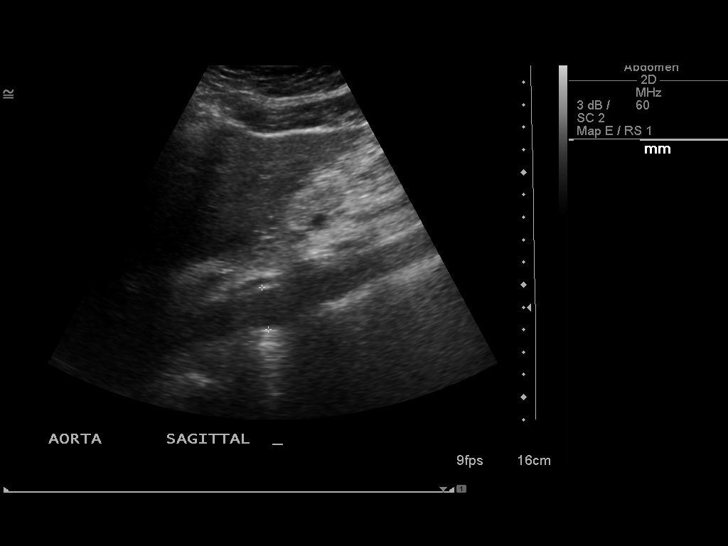
[im 7/77]
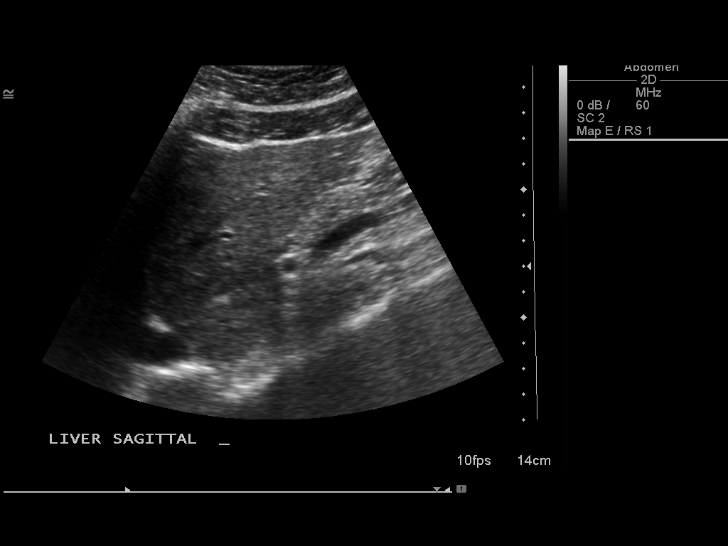
[im 13/77]
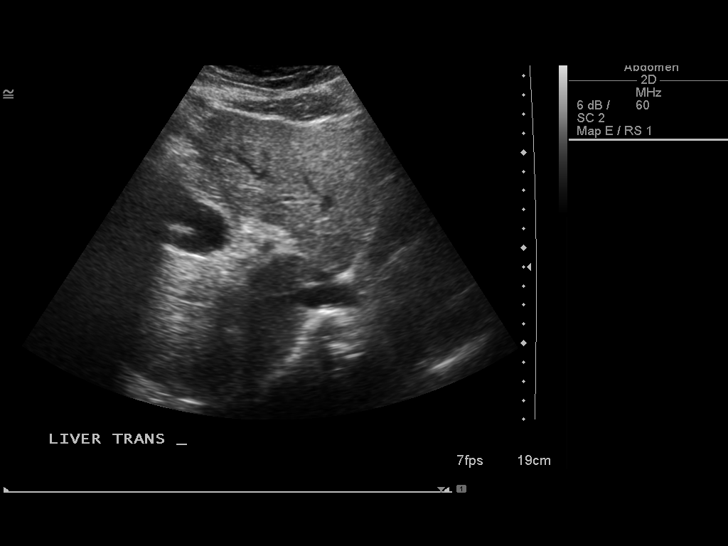
[im 20/77]
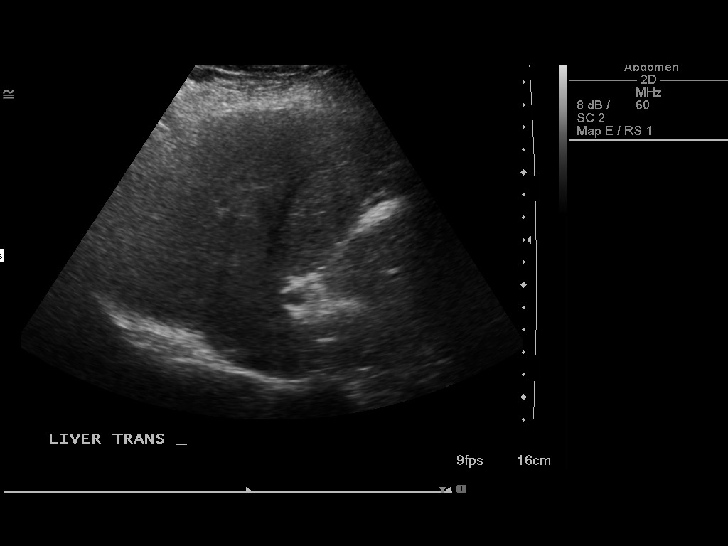
[im 26/77]
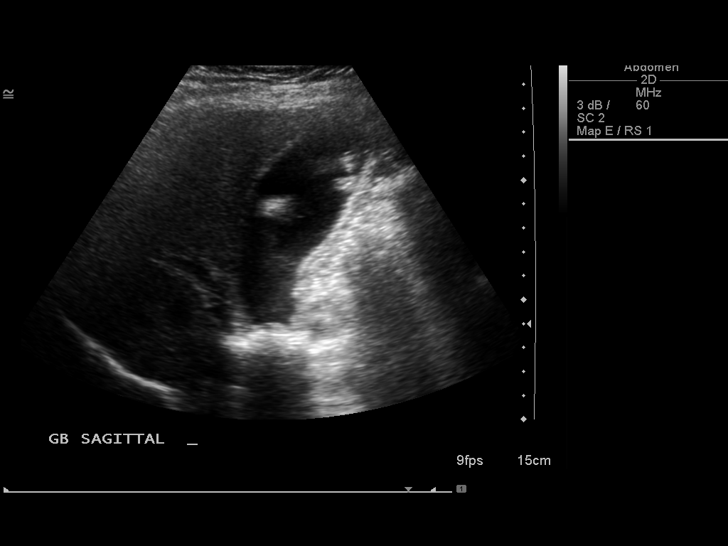
[im 29/77]
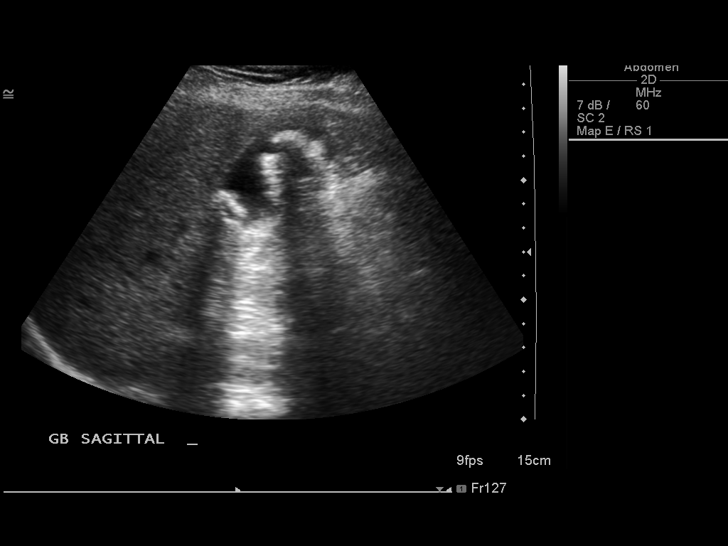
[im 35/77]
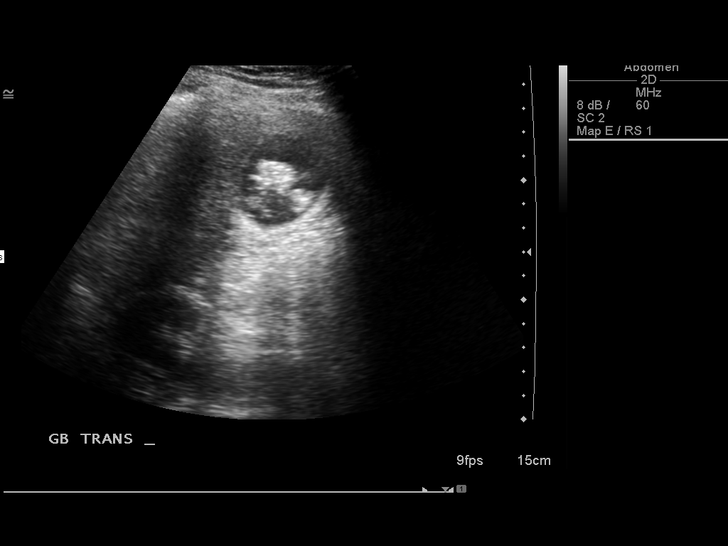
[im 42/77]
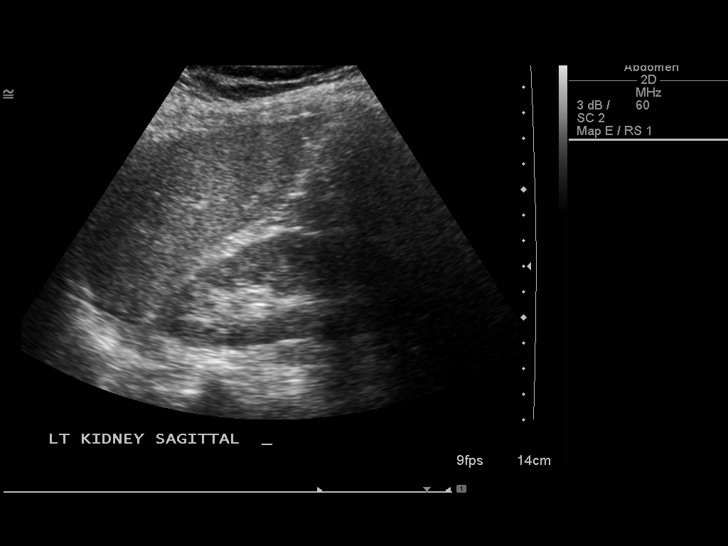
[im 48/77]
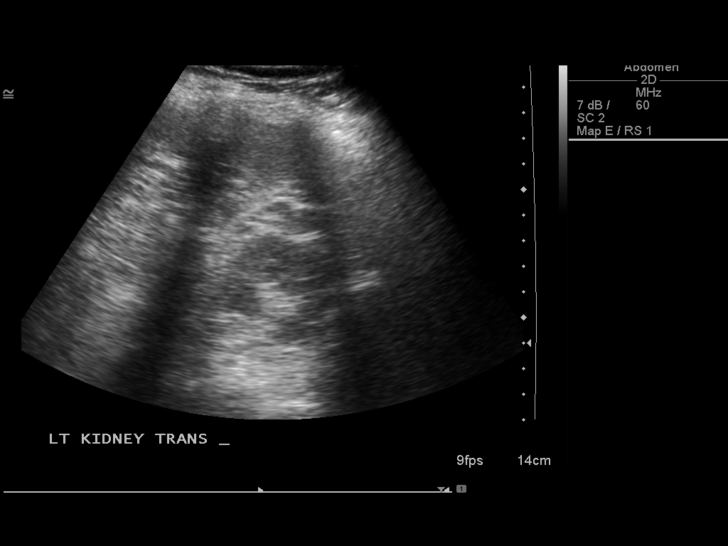
[im 51/77]
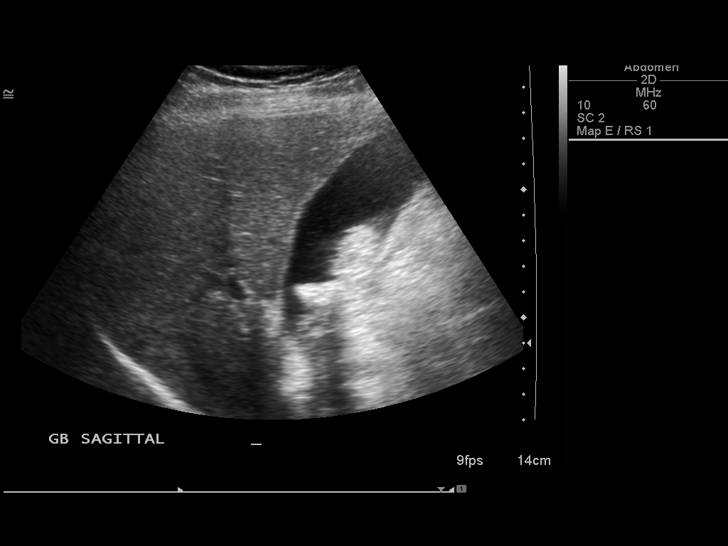
[im 58/77]
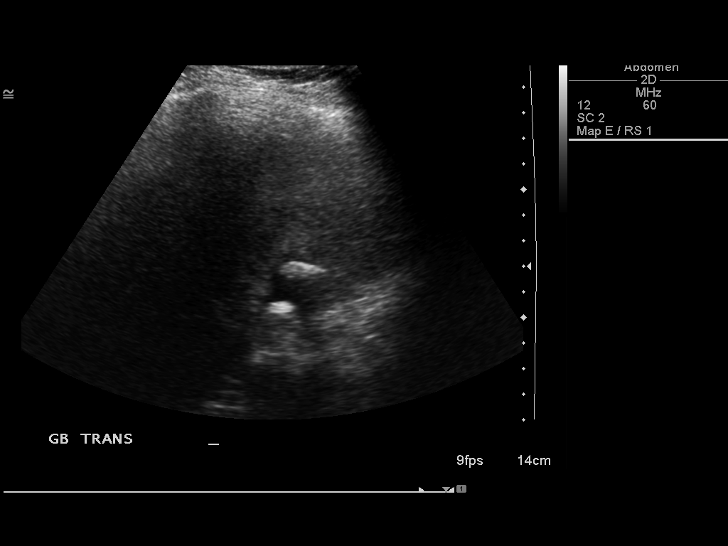
[im 64/77]
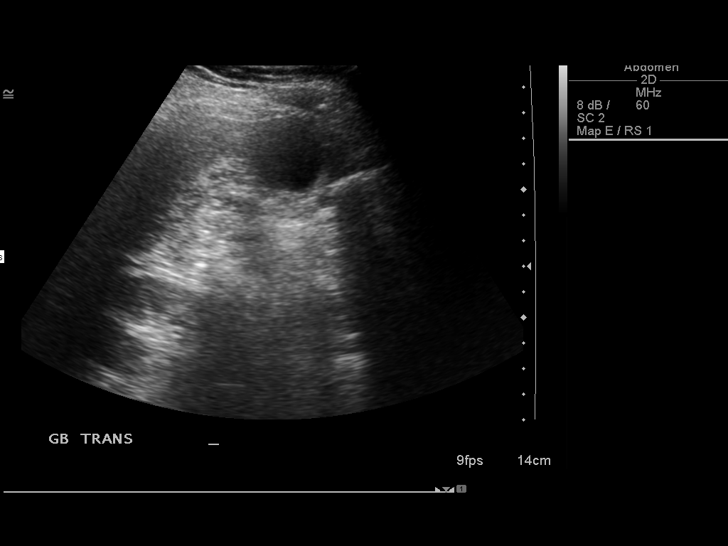
[im 70/77]
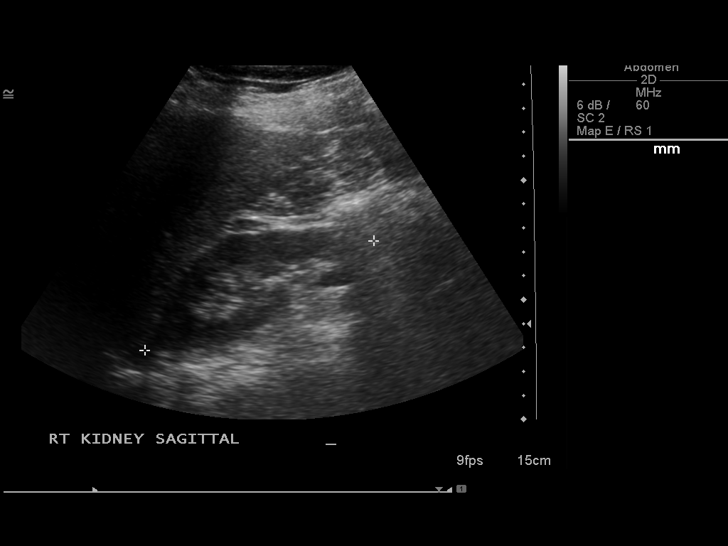
[im 77/77]
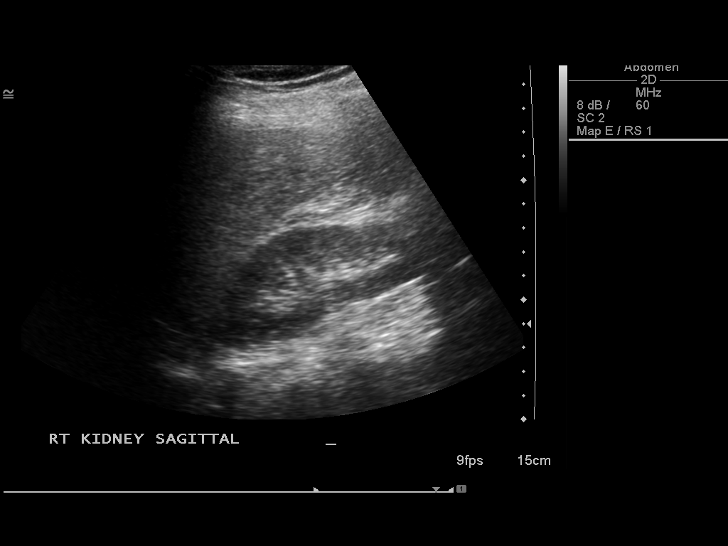

[14 of 25 positions shown; findings below may reference images not displayed]

FINDINGS: Gallbladder: Several sizable gallstones are again noted, including
a 1.6 cm gallstone within the gallbladder neck.  There is no
gallbladder wall thickening or sonographic Murphy's sign.

Common bile duct:   Normal in caliber without filling defects.

Liver:  Echogenicity is within normal limits.  No focal hepatic
abnormalities are identified.

IVC:  Visualized portions appear unremarkable.

Pancreas:  Visualized portions appear unremarkable.

Spleen:  Visualized portions appear unremarkable.

Right Kidney:   The renal cortical thickness and echogenicity are
preserved.  There is no hydronephrosis or focal abnormality. Renal
length is 10.6 cm.

Left Kidney:   The renal cortical thickness and echogenicity are
preserved.  There is no hydronephrosis or focal abnormality. Renal
length is 11.9 cm.

Abdominal aorta:  Visualized portions appear unremarkable.
IMPRESSION: Overall stable examination.  Cholelithiasis without evidence of
cholecystitis or biliary dilatation.

## 2013-08-09 IMAGING — RF DG CHOLANGIOGRAM OPERATIVE
1 series · 4 of 4 positions shown · non-contrast
Comparison: Ultrasound 05/31/2013

CLINICAL DATA: Gallstones.

INTRAOPERATIVE CHOLANGIOGRAM
TECHNIQUE: C-arm fluoroscopic images were obtained
intraoperatively and submitted for postoperative interpretation.
Please see the performing provider's procedural report for the
fluoroscopy time utilized.

[Series 1: run · 4 of 55 frames shown]
[frame 9/55]
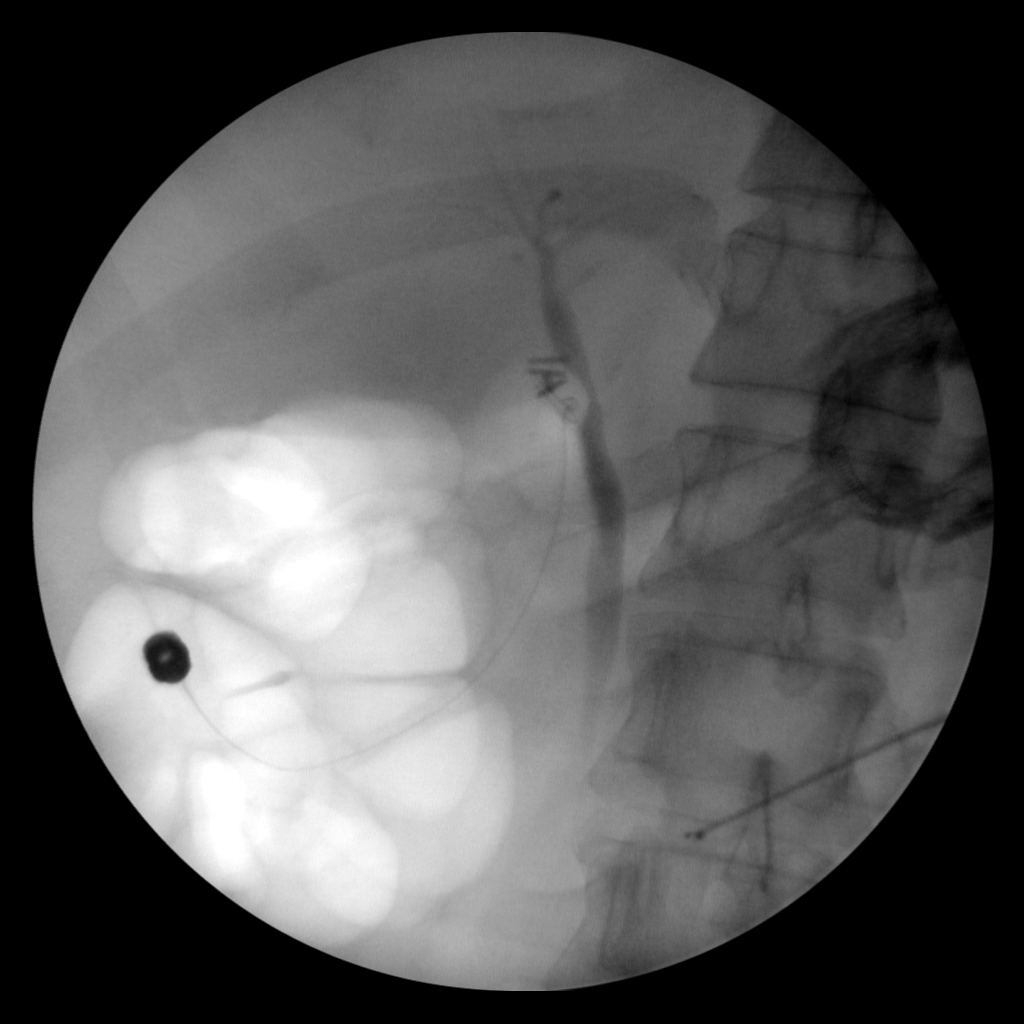
[frame 20/55]
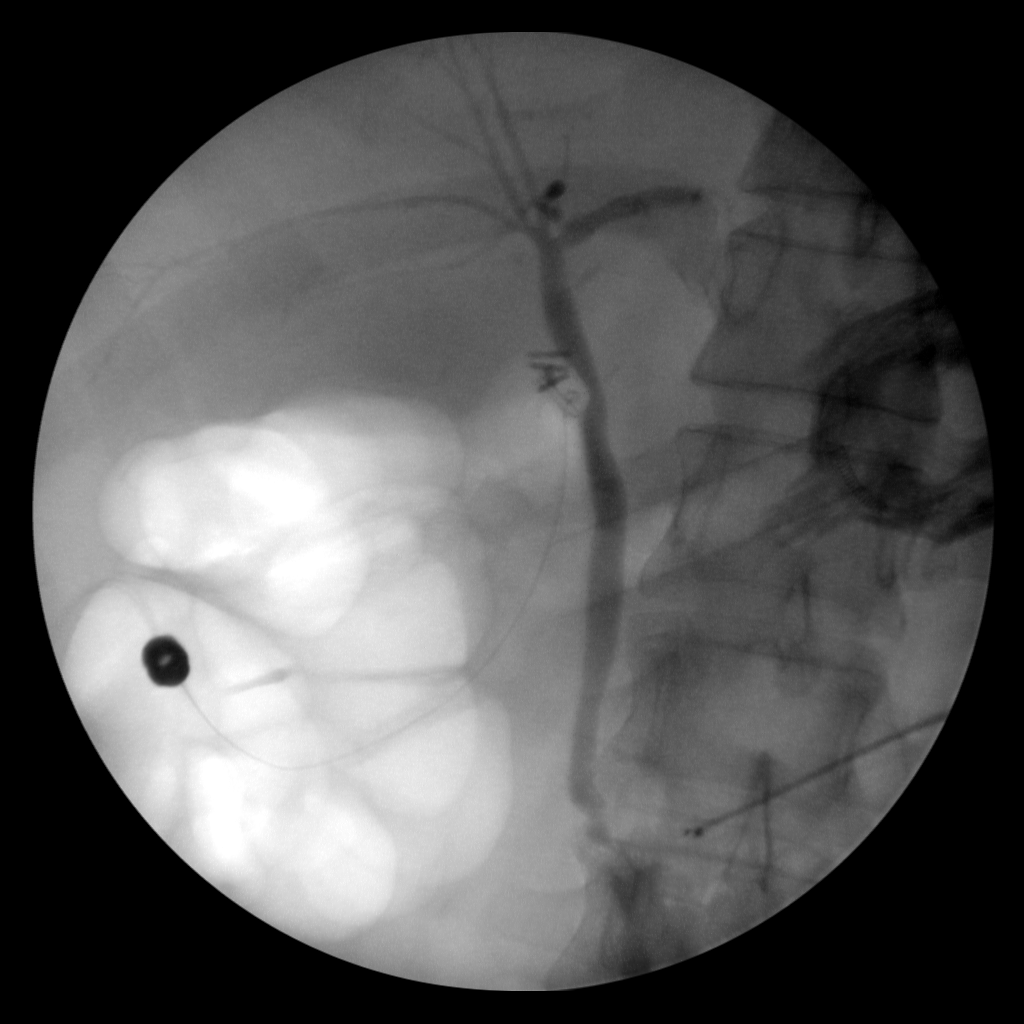
[frame 28/55]
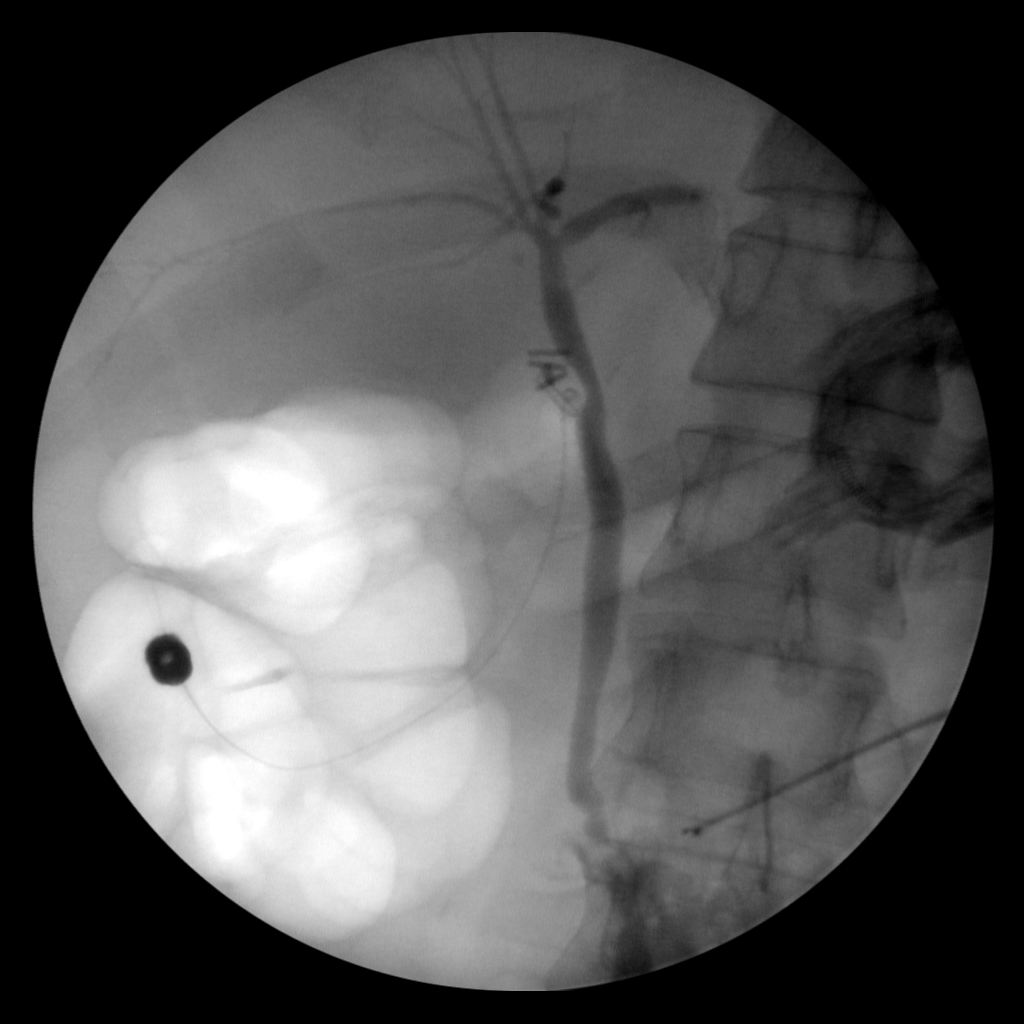
[frame 47/55]
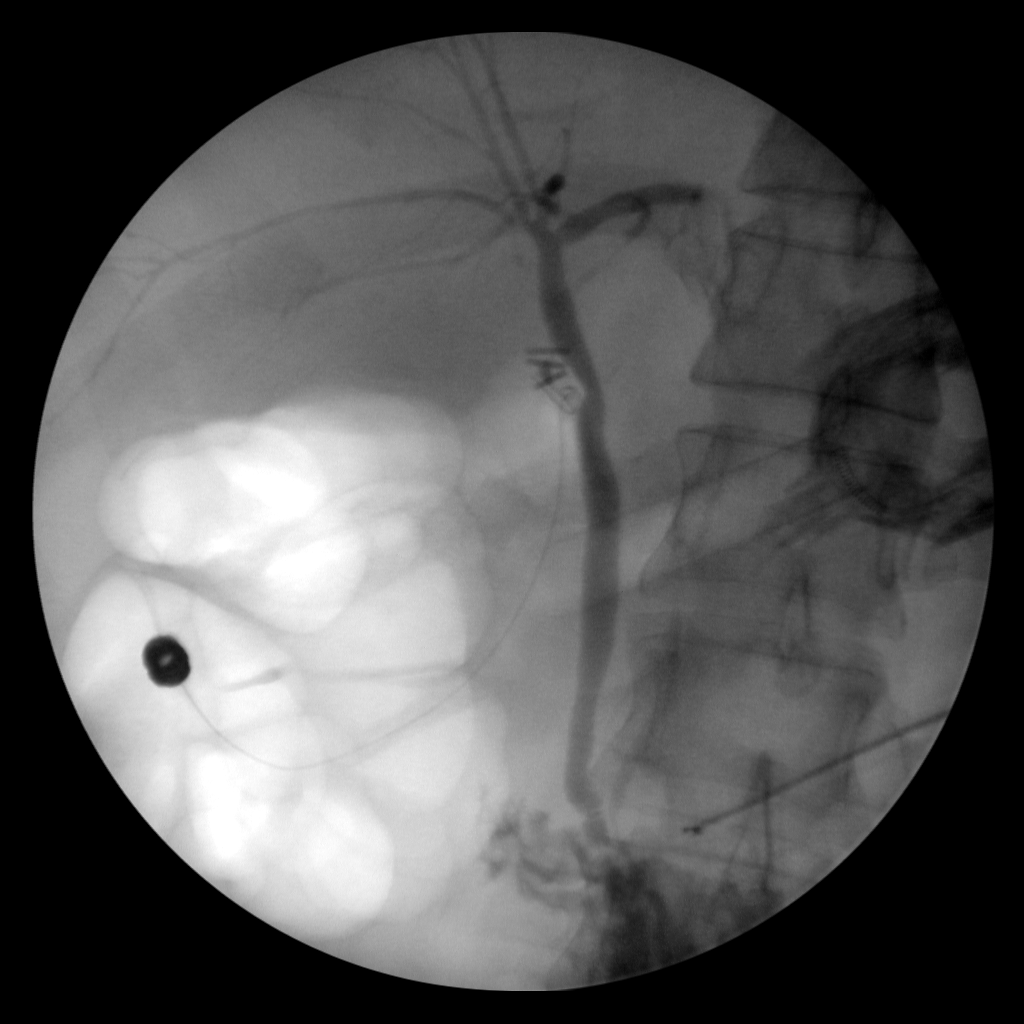

[4 of 4 positions shown; findings below may reference images not displayed]

FINDINGS: Opacification of the intrahepatic and extrahepatic
biliary system.  No significant biliary dilatation.  No large
filling defects or stones.  Contrast drains into the duodenum.
There is contrast reflux into the pancreatic duct.
IMPRESSION: Patent biliary system.  No evidence for an obstructing lesion or
stones.

## 2014-09-13 ENCOUNTER — Other Ambulatory Visit (HOSPITAL_COMMUNITY): Payer: Self-pay | Admitting: Primary Care

## 2014-09-13 DIAGNOSIS — Z1231 Encounter for screening mammogram for malignant neoplasm of breast: Secondary | ICD-10-CM

## 2014-09-27 ENCOUNTER — Ambulatory Visit (HOSPITAL_COMMUNITY)
Admission: RE | Admit: 2014-09-27 | Discharge: 2014-09-27 | Disposition: A | Discharge status: Home / Self Care | Payer: No Typology Code available for payment source | Source: Ambulatory Visit | Attending: Primary Care | Admitting: Primary Care

## 2014-09-27 DIAGNOSIS — Z1231 Encounter for screening mammogram for malignant neoplasm of breast: Secondary | ICD-10-CM

## 2016-12-27 ENCOUNTER — Encounter (HOSPITAL_COMMUNITY): Payer: Self-pay | Admitting: Emergency Medicine

## 2016-12-27 ENCOUNTER — Emergency Department (HOSPITAL_COMMUNITY)
Admission: EM | Admit: 2016-12-27 | Discharge: 2016-12-27 | Disposition: A | Discharge status: Home / Self Care | Payer: No Typology Code available for payment source | Attending: Emergency Medicine | Admitting: Emergency Medicine

## 2016-12-27 DIAGNOSIS — R197 Diarrhea, unspecified: Secondary | ICD-10-CM

## 2016-12-27 DIAGNOSIS — I10 Essential (primary) hypertension: Secondary | ICD-10-CM | POA: Insufficient documentation

## 2016-12-27 DIAGNOSIS — E039 Hypothyroidism, unspecified: Secondary | ICD-10-CM | POA: Insufficient documentation

## 2016-12-27 DIAGNOSIS — Z79899 Other long term (current) drug therapy: Secondary | ICD-10-CM | POA: Insufficient documentation

## 2016-12-27 LAB — CBC
HCT: 43 % (ref 36.0–46.0)
Hemoglobin: 15.6 g/dL — ABNORMAL HIGH (ref 12.0–15.0)
MCH: 29.8 pg (ref 26.0–34.0)
MCHC: 36.3 g/dL — AB (ref 30.0–36.0)
MCV: 82.2 fL (ref 78.0–100.0)
PLATELETS: 259 10*3/uL (ref 150–400)
RBC: 5.23 MIL/uL — ABNORMAL HIGH (ref 3.87–5.11)
RDW: 12.8 % (ref 11.5–15.5)
WBC: 6.3 10*3/uL (ref 4.0–10.5)

## 2016-12-27 LAB — COMPREHENSIVE METABOLIC PANEL
ALBUMIN: 4.2 g/dL (ref 3.5–5.0)
ALK PHOS: 118 U/L (ref 38–126)
ALT: 61 U/L — ABNORMAL HIGH (ref 14–54)
AST: 56 U/L — ABNORMAL HIGH (ref 15–41)
Anion gap: 7 (ref 5–15)
BILIRUBIN TOTAL: 0.8 mg/dL (ref 0.3–1.2)
BUN: 11 mg/dL (ref 6–20)
CALCIUM: 8.8 mg/dL — AB (ref 8.9–10.3)
CO2: 23 mmol/L (ref 22–32)
Chloride: 107 mmol/L (ref 101–111)
Creatinine, Ser: 0.7 mg/dL (ref 0.44–1.00)
GFR calc Af Amer: >60 mL/min (ref >60)
GFR calc non Af Amer: >60 mL/min (ref >60)
GLUCOSE: 110 mg/dL — AB (ref 65–99)
Potassium: 3.1 mmol/L — ABNORMAL LOW (ref 3.5–5.1)
SODIUM: 137 mmol/L (ref 135–145)
TOTAL PROTEIN: 8.4 g/dL — AB (ref 6.5–8.1)

## 2016-12-27 LAB — I-STAT BETA HCG BLOOD, ED (MC, WL, AP ONLY)

## 2016-12-27 LAB — LIPASE, BLOOD: Lipase: 26 U/L (ref 11–51)

## 2016-12-27 MED ORDER — LOPERAMIDE HCL 2 MG PO CAPS: 2.0000 mg | ORAL_CAPSULE | Freq: Four times a day (QID) | ORAL | 0 refills | Status: DC | PRN

## 2016-12-27 MED ORDER — POTASSIUM CHLORIDE CRYS ER 20 MEQ PO TBCR
40.0000 meq | EXTENDED_RELEASE_TABLET | Freq: Once | ORAL | Status: DC
  Filled 2016-12-27: qty 2

## 2016-12-27 MED ORDER — POTASSIUM CHLORIDE CRYS ER 20 MEQ PO TBCR: 40.0000 meq | EXTENDED_RELEASE_TABLET | Freq: Two times a day (BID) | ORAL | Status: DC

## 2016-12-27 MED ORDER — LOPERAMIDE HCL 2 MG PO CAPS: 2.0000 mg | ORAL_CAPSULE | Freq: Four times a day (QID) | ORAL | 0 refills | Status: AC | PRN

## 2016-12-27 NOTE — ED Triage Notes (Signed)
Pt complaint of diarrhea onset Wednesday after. Pt complaint of ONLY pain with diarrhea. Pt denies other symptoms.

## 2016-12-27 NOTE — Discharge Instructions (Signed)
Please take medications as prescribed.  Return without fail for worsening symptoms, including fever, intractable vomiting, severe constant abdominal pain, or any other symptoms concerning to you.

## 2016-12-27 NOTE — ED Provider Notes (Signed)
[facility]-EMER[facility]ENCY DEPT Provider Note   CSN: [id number] Arrival date & time: 12/27/16  0841     History   Chief Complaint Chief Complaint  Patient presents with  . Diarrhea    HPI Deborah Aguirre is a 51 y.o. female.  HPI 51 year old female who presents with diarrhea. History of cholelithiasis. Onset of diarrhea and decreased appetite 2 days ago. No known sick contacts, recent antibiotics, recent travel. Denies intermittent cramping associated with diarrhea but currently no abdominal pain. No fevers or chills, nausea or vomiting. Diarrhea nonbloody. 6 episodes yesterday and 2 episodes today.   Past Medical History:  Diagnosis Date  . Gallstone   . Hypertension   . Thyroid disease     Patient Active Problem List   Diagnosis Date Noted  . Biliary colic 05/31/2013  . HTN (hypertension) 05/31/2013  . Hypothyroid 05/31/2013    Past Surgical History:  Procedure Laterality Date  . CESAREAN SECTION     x 3  . CHOLECYSTECTOMY N/A 06/01/2013   Procedure: LAPAROSCOPIC CHOLECYSTECTOMY WITH INTRAOPERATIVE CHOLANGIOGRAM;  Surgeon: Robyne Askew, MD;  Location: [facility];  Service: General;  Laterality: N/A;    OB History    No data available       Home Medications    Prior to Admission medications   Medication Sig Start Date End Date Taking? Authorizing Provider  acetaminophen (TYLENOL) 500 MG tablet Take 1,000 mg by mouth every 6 (six) hours as needed for pain.    Historical Provider, MD  hydrochlorothiazide (HYDRODIURIL) 25 MG tablet Take 25 mg by mouth daily.    Historical Provider, MD  levothyroxine (SYNTHROID, LEVOTHROID) 112 MCG tablet Take 112 mcg by mouth daily before breakfast.    Historical Provider, MD  loperamide (IMODIUM) 2 MG capsule Take 1 capsule (2 mg total) by mouth 4 (four) times daily as needed for diarrhea or loose stools. 12/27/16   Lavera Guise, MD  metoprolol succinate (TOPROL-XL) 50 MG 24 hr tablet Take 50 mg by mouth daily. Take with or  immediately following a meal.    Historical Provider, MD  oxyCODONE (ROXICODONE) 5 MG immediate release tablet Take 1-2 tablets (5-10 mg total) by mouth every 4 (four) hours as needed for pain. 06/02/13   Barnetta Chapel, PA-C    Family History No family history on file.  Social History Social History  Substance Use Topics  . Smoking status: Never Smoker  . Smokeless tobacco: Never Used  . Alcohol use No     Allergies   Patient has no known allergies.   Review of Systems Review of Systems 10/14 systems reviewed and are negative other than those stated in the HPI   Physical Exam Updated Vital Signs BP (!) 171/106 (BP Location: Right Arm)   Pulse 104   Temp 98.2 F (36.8 C) (Oral)   Resp 16   LMP 12/25/2016   SpO2 96%   Physical Exam  Physical Exam  Nursing note and vitals reviewed. Constitutional: Well developed, well nourished, non-toxic, and in no acute distress Head: Normocephalic and atraumatic.  Mouth/Throat: Oropharynx is clear and moist.  Neck: Normal range of motion. Neck supple.  Cardiovascular: Normal rate and regular rhythm.   Pulmonary/Chest: Effort normal and breath sounds normal.  Abdominal: Soft. There is no tenderness. There is no rebound and no guarding.  Musculoskeletal: Normal range of motion.  Neurological: Alert, no facial droop, fluent speech, moves all extremities symmetrically Skin: Skin is warm and dry.  Psychiatric: Cooperative  ED Treatments /  Results  Labs (all labs ordered are listed, but only abnormal results are displayed) Labs Reviewed  COMPREHENSIVE METABOLIC PANEL - Abnormal; Notable for the following:       Result Value   Potassium 3.1 (*)    Glucose, Bld 110 (*)    Calcium 8.8 (*)    Total Protein 8.4 (*)    AST 56 (*)    ALT 61 (*)    All other components within normal limits  CBC - Abnormal; Notable for the following:    RBC 5.23 (*)    Hemoglobin 15.6 (*)    MCHC 36.3 (*)    All other components within normal  limits  LIPASE, BLOOD  URINALYSIS, ROUTINE W REFLEX MICROSCOPIC  I-STAT BETA HCG BLOOD, ED ([facility], [facility], [facility] ONLY)    EKG  EKG Interpretation None       Radiology No results found.  Procedures Procedures (including critical care time)  Medications Ordered in ED Medications  potassium chloride SA (K-DUR,KLOR-CON) CR tablet 40 mEq (not administered)     Initial Impression / Assessment and Plan / ED Course  I have reviewed the triage vital signs and the nursing notes.  Pertinent labs & imaging results that were available during my care of the patient were reviewed by me and considered in my medical decision making (see chart for details).  Clinical Course     Presenting with 2 days of diarrhea. She is well-appearing in no acute distress. She is a soft and benign abdomen. Blood work reviewed, and overall unremarkable aside from mild hypokalemia of 3.1 likely due to diarrheal illness. Discussed eating more fruits with potassium at home and given repletion here in the ED. I discussed continued supportive management from home.  Final Clinical Impressions(s) / ED Diagnoses   Final diagnoses:  Diarrhea, unspecified type    New Prescriptions New Prescriptions   LOPERAMIDE (IMODIUM) 2 MG CAPSULE    Take 1 capsule (2 mg total) by mouth 4 (four) times daily as needed for diarrhea or loose stools.     Lavera Guiseana Duo Lejon Afzal, MD 12/27/16 1034

## 2016-12-27 NOTE — ED Triage Notes (Signed)
Pt refused pills- pt given 8 oz of orange juice and will continue potassium rice foods at home. Per EDP

## 2017-04-24 ENCOUNTER — Emergency Department (HOSPITAL_COMMUNITY)
Admission: EM | Admit: 2017-04-24 | Discharge: 2017-04-24 | Disposition: A | Discharge status: Home / Self Care | Payer: Self-pay | Attending: Emergency Medicine | Admitting: Emergency Medicine

## 2017-04-24 ENCOUNTER — Encounter (HOSPITAL_COMMUNITY): Payer: Self-pay | Admitting: Emergency Medicine

## 2017-04-24 ENCOUNTER — Emergency Department (HOSPITAL_COMMUNITY): Payer: Self-pay

## 2017-04-24 DIAGNOSIS — Z79899 Other long term (current) drug therapy: Secondary | ICD-10-CM | POA: Insufficient documentation

## 2017-04-24 DIAGNOSIS — Y999 Unspecified external cause status: Secondary | ICD-10-CM | POA: Insufficient documentation

## 2017-04-24 DIAGNOSIS — E039 Hypothyroidism, unspecified: Secondary | ICD-10-CM | POA: Insufficient documentation

## 2017-04-24 DIAGNOSIS — Y92512 Supermarket, store or market as the place of occurrence of the external cause: Secondary | ICD-10-CM | POA: Insufficient documentation

## 2017-04-24 DIAGNOSIS — W010XXA Fall on same level from slipping, tripping and stumbling without subsequent striking against object, initial encounter: Secondary | ICD-10-CM | POA: Insufficient documentation

## 2017-04-24 DIAGNOSIS — I10 Essential (primary) hypertension: Secondary | ICD-10-CM | POA: Insufficient documentation

## 2017-04-24 DIAGNOSIS — Y939 Activity, unspecified: Secondary | ICD-10-CM | POA: Insufficient documentation

## 2017-04-24 DIAGNOSIS — S62235A Other nondisplaced fracture of base of first metacarpal bone, left hand, initial encounter for closed fracture: Secondary | ICD-10-CM | POA: Insufficient documentation

## 2017-04-24 MED ORDER — HYDROCHLOROTHIAZIDE 25 MG PO TABS: 25.0000 mg | ORAL_TABLET | Freq: Every day | ORAL | 0 refills | Status: AC

## 2017-04-24 MED ORDER — HYDROCODONE-ACETAMINOPHEN 7.5-325 MG/15ML PO SOLN: 10.0000 mL | Freq: Four times a day (QID) | ORAL | 0 refills | Status: AC | PRN

## 2017-04-24 MED ORDER — METOPROLOL SUCCINATE ER 50 MG PO TB24: 50.0000 mg | ORAL_TABLET | Freq: Every day | ORAL | 0 refills | Status: AC

## 2017-04-24 MED ORDER — HYDROCODONE-ACETAMINOPHEN 7.5-325 MG/15ML PO SOLN
10.0000 mL | Freq: Once | ORAL | Status: AC
  Administered 2017-04-24: 10 mL via ORAL
  Filled 2017-04-24: qty 15

## 2017-04-24 MED ORDER — IBUPROFEN 800 MG PO TABS
800.0000 mg | ORAL_TABLET | Freq: Once | ORAL | Status: DC
  Filled 2017-04-24: qty 1

## 2017-04-24 NOTE — ED Triage Notes (Signed)
L/hand swollen . L/thumb possible deformity.Pt slipped and fell on the floor at a store.one hour ago. Stated that she landed on her open l/hand. Denies LOC, denies striking head. Husband at bedside

## 2017-04-24 NOTE — Discharge Instructions (Signed)
Please call and follow up with hand specialist next week for further care of your broken bone.

## 2017-04-24 NOTE — ED Provider Notes (Signed)
[facility] Provider Note   CSN: [id number] Arrival date & time: 04/24/17  1458  By signing my name below, I, Deborah Aguirre, attest that this documentation has been prepared under the direction and in the presence of Fayrene HelperBowie Caison Hearn, PA-C. Electronically Signed: Marnette Burgessyan Andrew Aguirre, Scribe. 04/24/2017. 3:30 PM.  History   Chief Complaint Chief Complaint  Patient presents with  . Hand Pain  . Hand Injury   The history is provided by the patient and medical records. A language interpreter was used.    HPI Comments:  Deborah Aguirre is a 51 y.o. female with a PMHx of Prior Cholelithiasis, HTN, and Thyroid Disease, who presents to the Emergency Department complaining of sudden onset, throbbing, 10/10 left hand pain s/p a fall this afternoon just PTA. Pt reports slipping in a changing room in a store, falling forward and striking her left hand on the ground. Denies LOC or Head Injury. She localizes the pain to her left first digit with associated bruising and swelling. She tried an ice compress PTA with slight relief of her pain. Pt is right hand dominant. No pain in the left elbow or left wrist. Pt denies any other complaints at this time.  Language Interpreter ID: [id number]  Past Medical History:  Diagnosis Date  . Gallstone   . Hypertension   . Thyroid disease    Patient Active Problem List   Diagnosis Date Noted  . Biliary colic 05/31/2013  . HTN (hypertension) 05/31/2013  . Hypothyroid 05/31/2013   Past Surgical History:  Procedure Laterality Date  . CESAREAN SECTION     x 3  . CHOLECYSTECTOMY N/A 06/01/2013   Procedure: LAPAROSCOPIC CHOLECYSTECTOMY WITH INTRAOPERATIVE CHOLANGIOGRAM;  Surgeon: Robyne AskewPaul S Toth III, MD;  Location: [facility];  Service: General;  Laterality: N/A;   OB History    No data available     Home Medications    Prior to Admission medications   Medication Sig Start Date End Date Taking? Authorizing Provider  acetaminophen (TYLENOL) 500 MG tablet  Take 1,000 mg by mouth every 6 (six) hours as needed for pain.    [provider]  hydrochlorothiazide (HYDRODIURIL) 25 MG tablet Take 25 mg by mouth daily.    [provider]  levothyroxine (SYNTHROID, LEVOTHROID) 112 MCG tablet Take 112 mcg by mouth daily before breakfast.    [provider]  loperamide (IMODIUM) 2 MG capsule Take 1 capsule (2 mg total) by mouth 4 (four) times daily as needed for diarrhea or loose stools. 12/27/16   Lavera GuiseLiu, Dana Duo, MD  metoprolol succinate (TOPROL-XL) 50 MG 24 hr tablet Take 50 mg by mouth daily. Take with or immediately following a meal.    [provider]  oxyCODONE (ROXICODONE) 5 MG immediate release tablet Take 1-2 tablets (5-10 mg total) by mouth every 4 (four) hours as needed for pain. 06/02/13   Barnetta Chapelsborne, Kelly, PA-C   Family History Family History  Problem Relation Age of Onset  . Hypertension Mother     Social History Social History  Substance Use Topics  . Smoking status: Never Smoker  . Smokeless tobacco: Never Used  . Alcohol use No   Allergies   Patient has no known allergies.   Review of Systems Review of Systems  Musculoskeletal: Positive for arthralgias and joint swelling.  Neurological: Negative for syncope.   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 She appears well-developed and well-nourished.  HENT:  Head: Normocephalic.  Eyes: Conjunctivae are normal.  Cardiovascular: Normal rate.   Pulmonary/Chest: Effort normal.  Abdominal: She exhibits no distension.  Musculoskeletal: Normal range of motion.  Left hand- point tenderness noted to the base of the left thumb and along the thenar eminence, with bruising and  swelling noted. Point tenderness noted to the MCP of the index finger with faint bruising. Decreased ROM to the thumb and index finger secondary to pain. No gross deformity noted. Radial pulses 2+, normal wrist flexion and extension, supination and pronation. Left elbow is nontender. Left shoulder is nontender.   Neurological: She is alert and oriented to person, place, and time.  NVI. Sensation intact.   Skin: Skin is warm and dry.  Psychiatric: She has a normal mood and affect.  Nursing note and vitals reviewed.  ED Treatments / Results  DIAGNOSTIC STUDIES:  Oxygen Saturation is 99% on RA, normal by my interpretation.    COORDINATION OF CARE:  3:30 PM Discussed treatment plan with pt at bedside including XR of the left hand, splint, and pain medication and pt agreed to plan.  Labs (all labs ordered are listed, but only abnormal results are displayed) Labs Reviewed - No data to display  EKG  EKG Interpretation None       Radiology Dg Hand Complete Left  Result Date: 04/24/2017 CLINICAL DATA:  Left hand pain after falling EXAM: LEFT HAND - COMPLETE 3+ VIEW COMPARISON:  None. FINDINGS: There is a nondisplaced intra-articular fracture of the base of the left first metacarpal. There is associated soft tissue swelling. No other fracture. IMPRESSION: Nondisplaced intra-articular fracture of the left first metacarpal base. Electronically Signed   By: Deatra Robinson M.D.   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 I have spoken with the patient regarding hypertension and the need for improved management. I instructed the patient to followup with the Primary care doctor  within 4 days to improve the management of the patient's hypertension. I also counseled the patient regarding the signs and symptoms which would require an emergent visit to an emergency department for hypertensive urgency and/or hypertensive emergency. The patient understood the need for improved hypertensive management. Since pt haven't take her medication x 1 year, I will refill her BP medications.    Medications Ordered in ED Medications  ibuprofen (ADVIL,MOTRIN) tablet 800 mg (800 mg Oral Not Given 04/24/17 1640)  HYDROcodone-acetaminophen (HYCET) 7.5-325 mg/15 ml solution 10 mL (10 mLs Oral Given 04/24/17 1706)     Initial Impression / Assessment and Plan / ED Course  I have reviewed the triage vital signs and the nursing notes.  Pertinent labs & imaging results that were available during my care of the patient were reviewed by me and considered in my medical decision making (see chart for details).  Patient X-Ray positive fracture, Impression: Nondisplaced intra-articular fracture of the left first metacarpal base. Pt advised to follow up with orthopedics. Patient given splint while in ED, conservative therapy recommended and discussed. Patient will be discharged home & is agreeable with above plan. Returns precautions discussed. Pt appears safe for discharge.   Final Clinical Impressions(s) / ED Diagnoses   Final diagnoses:  Closed nondisplaced fracture of base of first metacarpal bone of left hand, initial encounter    New Prescriptions New Prescriptions   HYDROCODONE-ACETAMINOPHEN (HYCET) 7.5-325 MG/15 ML SOLUTION    Take 10 mLs by mouth every 6 (six) hours as needed for moderate pain or severe pain.    I personally performed the services described in this documentation, which was scribed in my presence. The recorded information has been reviewed and is accurate.       Fayrene Helper, PA-C 04/24/17 1716    Fayrene Helper, PA-C 04/24/17 1730    Mesner, Barbara Cower,  MD 04/24/17 [phone]

## 2017-04-24 NOTE — ED Notes (Signed)
Patient transported to X-ray
